# Patient Record
Sex: Male | Born: 1940 | Race: White | Hispanic: No | Marital: Married | Smoking: Former smoker
Health system: Southern US, Community
[De-identification: ages and names within clinical notes are randomized; demographics above are authoritative.]

## PROBLEM LIST (undated history)

## (undated) DIAGNOSIS — N400 Enlarged prostate without lower urinary tract symptoms: Secondary | ICD-10-CM

## (undated) DIAGNOSIS — K219 Gastro-esophageal reflux disease without esophagitis: Secondary | ICD-10-CM

## (undated) DIAGNOSIS — K5790 Diverticulosis of intestine, part unspecified, without perforation or abscess without bleeding: Secondary | ICD-10-CM

## (undated) DIAGNOSIS — D45 Polycythemia vera: Secondary | ICD-10-CM

## (undated) DIAGNOSIS — K279 Peptic ulcer, site unspecified, unspecified as acute or chronic, without hemorrhage or perforation: Secondary | ICD-10-CM

## (undated) DIAGNOSIS — Z860100 Personal history of colon polyps, unspecified: Secondary | ICD-10-CM

## (undated) DIAGNOSIS — M109 Gout, unspecified: Secondary | ICD-10-CM

## (undated) HISTORY — DX: Personal history of colon polyps, unspecified: Z86.0100

## (undated) HISTORY — DX: Gout, unspecified: M10.9

## (undated) HISTORY — DX: Benign prostatic hyperplasia without lower urinary tract symptoms: N40.0

## (undated) HISTORY — DX: Diverticulosis of intestine, part unspecified, without perforation or abscess without bleeding: K57.90

## (undated) HISTORY — DX: Gastro-esophageal reflux disease without esophagitis: K21.9

## (undated) HISTORY — PX: APPENDECTOMY: SHX54

## (undated) HISTORY — DX: Polycythemia vera: D45

## (undated) HISTORY — PX: COLONOSCOPY: SHX174

## (undated) HISTORY — DX: Personal history of colonic polyps: Z86.010

## (undated) HISTORY — PX: POLYPECTOMY: SHX149

## (undated) HISTORY — DX: Peptic ulcer, site unspecified, unspecified as acute or chronic, without hemorrhage or perforation: K27.9

---

## 1996-06-24 ENCOUNTER — Encounter: Payer: Self-pay | Admitting: Gastroenterology

## 1996-08-31 ENCOUNTER — Encounter: Payer: Self-pay | Admitting: Gastroenterology

## 1996-10-20 ENCOUNTER — Encounter: Payer: Self-pay | Admitting: Internal Medicine

## 1998-11-22 ENCOUNTER — Encounter: Payer: Self-pay | Admitting: Internal Medicine

## 2001-04-08 ENCOUNTER — Encounter: Payer: Self-pay | Admitting: Internal Medicine

## 2002-12-11 ENCOUNTER — Encounter: Payer: Self-pay | Admitting: Internal Medicine

## 2003-06-15 ENCOUNTER — Encounter: Payer: Self-pay | Admitting: Internal Medicine

## 2004-03-29 ENCOUNTER — Encounter: Admission: RE | Admit: 2004-03-29 | Discharge: 2004-03-29 | Payer: Self-pay | Admitting: Oncology

## 2004-03-29 ENCOUNTER — Encounter (HOSPITAL_COMMUNITY): Admission: RE | Admit: 2004-03-29 | Discharge: 2004-04-28 | Payer: Self-pay | Admitting: Oncology

## 2004-05-09 ENCOUNTER — Ambulatory Visit: Payer: Self-pay | Admitting: Oncology

## 2004-07-31 ENCOUNTER — Ambulatory Visit: Payer: Self-pay | Admitting: Internal Medicine

## 2004-08-03 ENCOUNTER — Ambulatory Visit: Payer: Self-pay | Admitting: Oncology

## 2004-10-05 ENCOUNTER — Ambulatory Visit: Payer: Self-pay | Admitting: Oncology

## 2004-11-22 ENCOUNTER — Ambulatory Visit: Payer: Self-pay | Admitting: Oncology

## 2005-01-08 ENCOUNTER — Ambulatory Visit: Payer: Self-pay | Admitting: Oncology

## 2005-01-28 ENCOUNTER — Emergency Department (HOSPITAL_COMMUNITY): Admission: EM | Admit: 2005-01-28 | Discharge: 2005-01-28 | Payer: Self-pay | Admitting: Emergency Medicine

## 2005-02-28 ENCOUNTER — Ambulatory Visit: Payer: Self-pay | Admitting: Internal Medicine

## 2005-03-12 ENCOUNTER — Ambulatory Visit: Payer: Self-pay | Admitting: Oncology

## 2005-04-10 ENCOUNTER — Ambulatory Visit: Payer: Self-pay | Admitting: Internal Medicine

## 2005-05-14 ENCOUNTER — Ambulatory Visit: Payer: Self-pay | Admitting: Oncology

## 2005-07-16 ENCOUNTER — Ambulatory Visit: Payer: Self-pay | Admitting: Oncology

## 2005-09-11 ENCOUNTER — Ambulatory Visit: Payer: Self-pay | Admitting: Oncology

## 2005-09-11 LAB — CBC WITH DIFFERENTIAL/PLATELET
Basophils Absolute: 0 10*3/uL (ref 0.0–0.1)
EOS%: 2.4 % (ref 0.0–7.0)
Eosinophils Absolute: 0.2 10*3/uL (ref 0.0–0.5)
HCT: 47.8 % (ref 38.7–49.9)
HGB: 15.1 g/dL (ref 13.0–17.1)
LYMPH%: 9.2 % — ABNORMAL LOW (ref 14.0–48.0)
MCH: 25.9 pg — ABNORMAL LOW (ref 28.0–33.4)
MCV: 81.9 fL (ref 81.6–98.0)
MONO%: 3.5 % (ref 0.0–13.0)
NEUT#: 8.6 10*3/uL — ABNORMAL HIGH (ref 1.5–6.5)
NEUT%: 84.6 % — ABNORMAL HIGH (ref 40.0–75.0)
Platelets: 555 10*3/uL — ABNORMAL HIGH (ref 145–400)
RDW: 19.3 % — ABNORMAL HIGH (ref 11.2–14.6)

## 2005-10-09 LAB — COMPREHENSIVE METABOLIC PANEL
AST: 24 U/L (ref 0–37)
Albumin: 4.7 g/dL (ref 3.5–5.2)
BUN: 18 mg/dL (ref 6–23)
Calcium: 9.2 mg/dL (ref 8.4–10.5)
Chloride: 111 mEq/L (ref 96–112)
Creatinine, Ser: 1.1 mg/dL (ref 0.4–1.5)
Glucose, Bld: 109 mg/dL — ABNORMAL HIGH (ref 70–99)

## 2005-10-09 LAB — MORPHOLOGY

## 2005-10-09 LAB — URIC ACID: Uric Acid, Serum: 8.6 mg/dL — ABNORMAL HIGH (ref 2.4–7.0)

## 2005-10-09 LAB — CBC WITH DIFFERENTIAL/PLATELET
Basophils Absolute: 0.1 10*3/uL (ref 0.0–0.1)
Eosinophils Absolute: 0.2 10*3/uL (ref 0.0–0.5)
HGB: 15.2 g/dL (ref 13.0–17.1)
LYMPH%: 8.6 % — ABNORMAL LOW (ref 14.0–48.0)
MCV: 83.2 fL (ref 81.6–98.0)
MONO#: 0.2 10*3/uL (ref 0.1–0.9)
MONO%: 2.3 % (ref 0.0–13.0)
NEUT#: 9.3 10*3/uL — ABNORMAL HIGH (ref 1.5–6.5)
Platelets: 580 10*3/uL — ABNORMAL HIGH (ref 145–400)
WBC: 10.8 10*3/uL — ABNORMAL HIGH (ref 4.0–10.0)

## 2005-10-09 LAB — PSA: PSA: 0.57 ng/mL (ref 0.10–4.00)

## 2005-10-09 LAB — LACTATE DEHYDROGENASE: LDH: 173 U/L (ref 94–250)

## 2005-11-07 ENCOUNTER — Ambulatory Visit: Payer: Self-pay | Admitting: Oncology

## 2005-11-13 LAB — CBC WITH DIFFERENTIAL/PLATELET
BASO%: 0.5 % (ref 0.0–2.0)
EOS%: 2 % (ref 0.0–7.0)
LYMPH%: 10.7 % — ABNORMAL LOW (ref 14.0–48.0)
MCH: 26.4 pg — ABNORMAL LOW (ref 28.0–33.4)
MCHC: 32 g/dL (ref 32.0–35.9)
MCV: 82.6 fL (ref 81.6–98.0)
MONO%: 2.9 % (ref 0.0–13.0)
Platelets: 539 10*3/uL — ABNORMAL HIGH (ref 145–400)
RBC: 5.88 10*6/uL — ABNORMAL HIGH (ref 4.20–5.71)
WBC: 9.1 10*3/uL (ref 4.0–10.0)

## 2005-11-13 LAB — MORPHOLOGY

## 2006-01-09 ENCOUNTER — Ambulatory Visit: Payer: Self-pay | Admitting: Oncology

## 2006-01-10 LAB — CBC WITH DIFFERENTIAL/PLATELET
Basophils Absolute: 0 10*3/uL (ref 0.0–0.1)
Eosinophils Absolute: 0.2 10*3/uL (ref 0.0–0.5)
HGB: 15.5 g/dL (ref 13.0–17.1)
LYMPH%: 9.7 % — ABNORMAL LOW (ref 14.0–48.0)
MCV: 84.4 fL (ref 81.6–98.0)
MONO%: 2.8 % (ref 0.0–13.0)
NEUT#: 7.7 10*3/uL — ABNORMAL HIGH (ref 1.5–6.5)
Platelets: 518 10*3/uL — ABNORMAL HIGH (ref 145–400)
RBC: 5.83 10*6/uL — ABNORMAL HIGH (ref 4.20–5.71)

## 2006-01-10 LAB — MORPHOLOGY: PLT EST: INCREASED

## 2006-02-12 LAB — CBC WITH DIFFERENTIAL/PLATELET
BASO%: 0.1 % (ref 0.0–2.0)
HCT: 49.1 % (ref 38.7–49.9)
HGB: 15.5 g/dL (ref 13.0–17.1)
MCHC: 31.6 g/dL — ABNORMAL LOW (ref 32.0–35.9)
MONO#: 0.3 10*3/uL (ref 0.1–0.9)
NEUT%: 85.6 % — ABNORMAL HIGH (ref 40.0–75.0)
RDW: 18.7 % — ABNORMAL HIGH (ref 11.2–14.6)
WBC: 11.5 10*3/uL — ABNORMAL HIGH (ref 4.0–10.0)
lymph#: 1.1 10*3/uL (ref 0.9–3.3)

## 2006-02-12 LAB — MORPHOLOGY

## 2006-03-08 ENCOUNTER — Ambulatory Visit: Payer: Self-pay | Admitting: Internal Medicine

## 2006-03-08 ENCOUNTER — Ambulatory Visit: Payer: Self-pay | Admitting: Oncology

## 2006-03-12 LAB — CBC WITH DIFFERENTIAL/PLATELET
BASO%: 0.2 % (ref 0.0–2.0)
EOS%: 2 % (ref 0.0–7.0)
HCT: 49.5 % (ref 38.7–49.9)
MCH: 26.6 pg — ABNORMAL LOW (ref 28.0–33.4)
MCHC: 31.3 g/dL — ABNORMAL LOW (ref 32.0–35.9)
MCV: 84.8 fL (ref 81.6–98.0)
MONO%: 2.3 % (ref 0.0–13.0)
NEUT%: 86.8 % — ABNORMAL HIGH (ref 40.0–75.0)
RDW: 18.5 % — ABNORMAL HIGH (ref 11.2–14.6)
lymph#: 1 10*3/uL (ref 0.9–3.3)

## 2006-03-12 LAB — MORPHOLOGY: PLT EST: INCREASED

## 2006-03-22 ENCOUNTER — Ambulatory Visit: Payer: Self-pay | Admitting: Internal Medicine

## 2006-03-22 ENCOUNTER — Encounter (INDEPENDENT_AMBULATORY_CARE_PROVIDER_SITE_OTHER): Payer: Self-pay | Admitting: *Deleted

## 2006-03-27 ENCOUNTER — Ambulatory Visit: Payer: Self-pay | Admitting: Internal Medicine

## 2006-04-09 LAB — COMPREHENSIVE METABOLIC PANEL
ALT: 20 U/L (ref 0–40)
AST: 23 U/L (ref 0–37)
Calcium: 9.5 mg/dL (ref 8.4–10.5)
Chloride: 107 mEq/L (ref 96–112)
Creatinine, Ser: 0.98 mg/dL (ref 0.40–1.50)

## 2006-04-09 LAB — CBC WITH DIFFERENTIAL/PLATELET
Basophils Absolute: 0 10*3/uL (ref 0.0–0.1)
Eosinophils Absolute: 0.1 10*3/uL (ref 0.0–0.5)
HCT: 51.4 % — ABNORMAL HIGH (ref 38.7–49.9)
HGB: 16.1 g/dL (ref 13.0–17.1)
MCH: 26.8 pg — ABNORMAL LOW (ref 28.0–33.4)
MONO#: 0.2 10*3/uL (ref 0.1–0.9)
NEUT#: 8.3 10*3/uL — ABNORMAL HIGH (ref 1.5–6.5)
NEUT%: 87.8 % — ABNORMAL HIGH (ref 40.0–75.0)
RDW: 19.6 % — ABNORMAL HIGH (ref 11.2–14.6)
lymph#: 0.8 10*3/uL — ABNORMAL LOW (ref 0.9–3.3)

## 2006-04-09 LAB — MORPHOLOGY: PLT EST: ADEQUATE

## 2006-04-09 LAB — ERYTHROCYTE SEDIMENTATION RATE: Sed Rate: 0 mm/hr (ref 0–20)

## 2006-04-24 ENCOUNTER — Ambulatory Visit: Payer: Self-pay | Admitting: Oncology

## 2006-05-24 LAB — CBC WITH DIFFERENTIAL/PLATELET
BASO%: 1 % (ref 0.0–2.0)
Eosinophils Absolute: 0.2 10*3/uL (ref 0.0–0.5)
MONO#: 0.4 10*3/uL (ref 0.1–0.9)
NEUT#: 9.9 10*3/uL — ABNORMAL HIGH (ref 1.5–6.5)
RBC: 5.87 10*6/uL — ABNORMAL HIGH (ref 4.20–5.71)
RDW: 17.7 % — ABNORMAL HIGH (ref 11.2–14.6)
WBC: 11.5 10*3/uL — ABNORMAL HIGH (ref 4.0–10.0)

## 2006-05-24 LAB — MORPHOLOGY: PLT EST: INCREASED

## 2006-06-18 ENCOUNTER — Ambulatory Visit: Payer: Self-pay | Admitting: Oncology

## 2006-06-21 LAB — CBC WITH DIFFERENTIAL/PLATELET
EOS%: 1.4 % (ref 0.0–7.0)
Eosinophils Absolute: 0.1 10*3/uL (ref 0.0–0.5)
LYMPH%: 9.9 % — ABNORMAL LOW (ref 14.0–48.0)
MCH: 27.4 pg — ABNORMAL LOW (ref 28.0–33.4)
MCV: 86.8 fL (ref 81.6–98.0)
MONO%: 3.1 % (ref 0.0–13.0)
NEUT#: 8.6 10*3/uL — ABNORMAL HIGH (ref 1.5–6.5)
Platelets: 517 10*3/uL — ABNORMAL HIGH (ref 145–400)
RBC: 6 10*6/uL — ABNORMAL HIGH (ref 4.20–5.71)
RDW: 18.6 % — ABNORMAL HIGH (ref 11.2–14.6)

## 2006-06-21 LAB — MORPHOLOGY

## 2006-07-19 LAB — CBC WITH DIFFERENTIAL/PLATELET
Eosinophils Absolute: 0.2 10*3/uL (ref 0.0–0.5)
HCT: 48.2 % (ref 38.7–49.9)
LYMPH%: 8.8 % — ABNORMAL LOW (ref 14.0–48.0)
MCHC: 32.4 g/dL (ref 32.0–35.9)
MCV: 85.7 fL (ref 81.6–98.0)
MONO#: 0.2 10*3/uL (ref 0.1–0.9)
MONO%: 2.7 % (ref 0.0–13.0)
NEUT#: 7.7 10*3/uL — ABNORMAL HIGH (ref 1.5–6.5)
NEUT%: 86 % — ABNORMAL HIGH (ref 40.0–75.0)
Platelets: 443 10*3/uL — ABNORMAL HIGH (ref 145–400)
WBC: 9 10*3/uL (ref 4.0–10.0)

## 2006-07-19 LAB — MORPHOLOGY: PLT EST: INCREASED

## 2006-08-13 ENCOUNTER — Ambulatory Visit: Payer: Self-pay | Admitting: Oncology

## 2006-08-16 LAB — CBC WITH DIFFERENTIAL/PLATELET
BASO%: 0.3 % (ref 0.0–2.0)
LYMPH%: 9.2 % — ABNORMAL LOW (ref 14.0–48.0)
MCHC: 32.3 g/dL (ref 32.0–35.9)
MONO#: 0.2 10*3/uL (ref 0.1–0.9)
RBC: 5.72 10*6/uL — ABNORMAL HIGH (ref 4.20–5.71)
RDW: 19.2 % — ABNORMAL HIGH (ref 11.2–14.6)
WBC: 9.9 10*3/uL (ref 4.0–10.0)
lymph#: 0.9 10*3/uL (ref 0.9–3.3)

## 2006-08-16 LAB — MORPHOLOGY: PLT EST: INCREASED

## 2006-08-16 LAB — COMPREHENSIVE METABOLIC PANEL
AST: 20 U/L (ref 0–37)
Albumin: 4.8 g/dL (ref 3.5–5.2)
Alkaline Phosphatase: 55 U/L (ref 39–117)
Glucose, Bld: 125 mg/dL — ABNORMAL HIGH (ref 70–99)
Potassium: 5.4 mEq/L — ABNORMAL HIGH (ref 3.5–5.3)
Sodium: 145 mEq/L (ref 135–145)
Total Protein: 6.7 g/dL (ref 6.0–8.3)

## 2006-09-20 LAB — CBC WITH DIFFERENTIAL/PLATELET
BASO%: 0.2 % (ref 0.0–2.0)
LYMPH%: 10.5 % — ABNORMAL LOW (ref 14.0–48.0)
MCHC: 32.5 g/dL (ref 32.0–35.9)
MCV: 86.7 fL (ref 81.6–98.0)
MONO%: 3.3 % (ref 0.0–13.0)
Platelets: 443 10*3/uL — ABNORMAL HIGH (ref 145–400)
RBC: 5.74 10*6/uL — ABNORMAL HIGH (ref 4.20–5.71)

## 2006-09-20 LAB — MORPHOLOGY

## 2006-10-16 ENCOUNTER — Ambulatory Visit: Payer: Self-pay | Admitting: Oncology

## 2006-10-18 LAB — CBC WITH DIFFERENTIAL/PLATELET
BASO%: 0.3 % (ref 0.0–2.0)
Eosinophils Absolute: 0.1 10*3/uL (ref 0.0–0.5)
MCHC: 32.5 g/dL (ref 32.0–35.9)
MONO#: 0.2 10*3/uL (ref 0.1–0.9)
NEUT#: 9.5 10*3/uL — ABNORMAL HIGH (ref 1.5–6.5)
RBC: 5.25 10*6/uL (ref 4.20–5.71)
RDW: 17.9 % — ABNORMAL HIGH (ref 11.2–14.6)
WBC: 10.7 10*3/uL — ABNORMAL HIGH (ref 4.0–10.0)
lymph#: 0.8 10*3/uL — ABNORMAL LOW (ref 0.9–3.3)

## 2006-10-18 LAB — MORPHOLOGY: PLT EST: INCREASED

## 2006-10-18 LAB — COMPREHENSIVE METABOLIC PANEL
Albumin: 4.4 g/dL (ref 3.5–5.2)
CO2: 29 mEq/L (ref 19–32)
Calcium: 9.2 mg/dL (ref 8.4–10.5)
Creatinine, Ser: 1.04 mg/dL (ref 0.40–1.50)
Total Protein: 6.4 g/dL (ref 6.0–8.3)

## 2006-11-25 LAB — MORPHOLOGY: PLT EST: INCREASED

## 2006-11-25 LAB — CBC WITH DIFFERENTIAL/PLATELET
BASO%: 0.5 % (ref 0.0–2.0)
Eosinophils Absolute: 0.2 10*3/uL (ref 0.0–0.5)
LYMPH%: 8.9 % — ABNORMAL LOW (ref 14.0–48.0)
MCHC: 32 g/dL (ref 32.0–35.9)
MCV: 86.3 fL (ref 81.6–98.0)
MONO#: 0.3 10*3/uL (ref 0.1–0.9)
MONO%: 2.8 % (ref 0.0–13.0)
NEUT#: 9.8 10*3/uL — ABNORMAL HIGH (ref 1.5–6.5)
RBC: 5.53 10*6/uL (ref 4.20–5.71)
RDW: 17 % — ABNORMAL HIGH (ref 11.2–14.6)
WBC: 11.5 10*3/uL — ABNORMAL HIGH (ref 4.0–10.0)

## 2006-12-25 ENCOUNTER — Ambulatory Visit: Payer: Self-pay | Admitting: Oncology

## 2006-12-30 LAB — CBC WITH DIFFERENTIAL/PLATELET
BASO%: 0.4 % (ref 0.0–2.0)
EOS%: 2 % (ref 0.0–7.0)
HCT: 47.6 % (ref 38.7–49.9)
MCH: 27.5 pg — ABNORMAL LOW (ref 28.0–33.4)
MCHC: 32.4 g/dL (ref 32.0–35.9)
MONO#: 0.4 10*3/uL (ref 0.1–0.9)
RBC: 5.6 10*6/uL (ref 4.20–5.71)
RDW: 18.4 % — ABNORMAL HIGH (ref 11.2–14.6)
WBC: 11.3 10*3/uL — ABNORMAL HIGH (ref 4.0–10.0)
lymph#: 0.9 10*3/uL (ref 0.9–3.3)

## 2006-12-30 LAB — MORPHOLOGY

## 2007-01-24 LAB — CBC WITH DIFFERENTIAL/PLATELET
BASO%: 1.6 % (ref 0.0–2.0)
HCT: 47.1 % (ref 38.7–49.9)
LYMPH%: 7.5 % — ABNORMAL LOW (ref 14.0–48.0)
MCHC: 32.3 g/dL (ref 32.0–35.9)
MCV: 84 fL (ref 81.6–98.0)
MONO#: 0.3 10*3/uL (ref 0.1–0.9)
MONO%: 2.7 % (ref 0.0–13.0)
NEUT%: 85.8 % — ABNORMAL HIGH (ref 40.0–75.0)
Platelets: 574 10*3/uL — ABNORMAL HIGH (ref 145–400)
RBC: 5.61 10*6/uL (ref 4.20–5.71)

## 2007-01-24 LAB — MORPHOLOGY: PLT EST: INCREASED

## 2007-02-08 ENCOUNTER — Encounter: Payer: Self-pay | Admitting: Internal Medicine

## 2007-02-08 DIAGNOSIS — Z860100 Personal history of colon polyps, unspecified: Secondary | ICD-10-CM | POA: Insufficient documentation

## 2007-02-08 DIAGNOSIS — D45 Polycythemia vera: Secondary | ICD-10-CM | POA: Insufficient documentation

## 2007-02-26 ENCOUNTER — Ambulatory Visit: Payer: Self-pay | Admitting: Oncology

## 2007-02-28 LAB — MORPHOLOGY: PLT EST: INCREASED

## 2007-02-28 LAB — CBC WITH DIFFERENTIAL/PLATELET
Basophils Absolute: 0 10*3/uL (ref 0.0–0.1)
Eosinophils Absolute: 0.1 10*3/uL (ref 0.0–0.5)
HGB: 15.4 g/dL (ref 13.0–17.1)
MCV: 83.8 fL (ref 81.6–98.0)
MONO#: 0.3 10*3/uL (ref 0.1–0.9)
NEUT#: 8.6 10*3/uL — ABNORMAL HIGH (ref 1.5–6.5)
RBC: 5.63 10*6/uL (ref 4.20–5.71)
RDW: 18.5 % — ABNORMAL HIGH (ref 11.2–14.6)
WBC: 9.7 10*3/uL (ref 4.0–10.0)

## 2007-03-28 LAB — CBC WITH DIFFERENTIAL/PLATELET
BASO%: 0.2 % (ref 0.0–2.0)
Basophils Absolute: 0 10*3/uL (ref 0.0–0.1)
EOS%: 1.8 % (ref 0.0–7.0)
HGB: 16.3 g/dL (ref 13.0–17.1)
MCH: 27.3 pg — ABNORMAL LOW (ref 28.0–33.4)
MONO#: 0.3 10*3/uL (ref 0.1–0.9)
RDW: 19.1 % — ABNORMAL HIGH (ref 11.2–14.6)
WBC: 12.1 10*3/uL — ABNORMAL HIGH (ref 4.0–10.0)
lymph#: 1.1 10*3/uL (ref 0.9–3.3)

## 2007-03-28 LAB — MORPHOLOGY

## 2007-04-16 ENCOUNTER — Ambulatory Visit: Payer: Self-pay | Admitting: Oncology

## 2007-05-05 LAB — CBC WITH DIFFERENTIAL/PLATELET
BASO%: 0.4 % (ref 0.0–2.0)
LYMPH%: 7.4 % — ABNORMAL LOW (ref 14.0–48.0)
MCHC: 31.9 g/dL — ABNORMAL LOW (ref 32.0–35.9)
MCV: 85.4 fL (ref 81.6–98.0)
MONO%: 2.4 % (ref 0.0–13.0)
Platelets: 422 10*3/uL — ABNORMAL HIGH (ref 145–400)
RBC: 5.53 10*6/uL (ref 4.20–5.71)
WBC: 9.6 10*3/uL (ref 4.0–10.0)

## 2007-05-05 LAB — MORPHOLOGY

## 2007-06-06 ENCOUNTER — Ambulatory Visit: Payer: Self-pay | Admitting: Oncology

## 2007-06-06 LAB — CBC WITH DIFFERENTIAL/PLATELET
Basophils Absolute: 0.2 10*3/uL — ABNORMAL HIGH (ref 0.0–0.1)
EOS%: 1.9 % (ref 0.0–7.0)
HCT: 48 % (ref 38.7–49.9)
HGB: 15.5 g/dL (ref 13.0–17.1)
MCH: 27.7 pg — ABNORMAL LOW (ref 28.0–33.4)
MCV: 85.5 fL (ref 81.6–98.0)
MONO%: 2.9 % (ref 0.0–13.0)
NEUT%: 84 % — ABNORMAL HIGH (ref 40.0–75.0)

## 2007-07-03 LAB — CBC WITH DIFFERENTIAL/PLATELET
BASO%: 0.2 % (ref 0.0–2.0)
LYMPH%: 8 % — ABNORMAL LOW (ref 14.0–48.0)
MCHC: 32.5 g/dL (ref 32.0–35.9)
MONO#: 0.3 10*3/uL (ref 0.1–0.9)
Platelets: 569 10*3/uL — ABNORMAL HIGH (ref 145–400)
RBC: 5.6 10*6/uL (ref 4.20–5.71)
RDW: 18.2 % — ABNORMAL HIGH (ref 11.2–14.6)
WBC: 11.4 10*3/uL — ABNORMAL HIGH (ref 4.0–10.0)

## 2007-07-03 LAB — MORPHOLOGY

## 2007-07-29 ENCOUNTER — Ambulatory Visit: Payer: Self-pay | Admitting: Oncology

## 2007-07-31 LAB — CBC WITH DIFFERENTIAL/PLATELET
Basophils Absolute: 0.1 10*3/uL (ref 0.0–0.1)
Eosinophils Absolute: 0.2 10*3/uL (ref 0.0–0.5)
HCT: 48.3 % (ref 38.7–49.9)
HGB: 15.6 g/dL (ref 13.0–17.1)
LYMPH%: 9.3 % — ABNORMAL LOW (ref 14.0–48.0)
MCV: 87.1 fL (ref 81.6–98.0)
MONO#: 0.2 10*3/uL (ref 0.1–0.9)
NEUT#: 9.3 10*3/uL — ABNORMAL HIGH (ref 1.5–6.5)
Platelets: 568 10*3/uL — ABNORMAL HIGH (ref 145–400)
RBC: 5.54 10*6/uL (ref 4.20–5.71)
WBC: 10.9 10*3/uL — ABNORMAL HIGH (ref 4.0–10.0)

## 2007-07-31 LAB — MORPHOLOGY: PLT EST: INCREASED

## 2007-08-28 LAB — CBC WITH DIFFERENTIAL/PLATELET
BASO%: 0.8 % (ref 0.0–2.0)
EOS%: 2.1 % (ref 0.0–7.0)
LYMPH%: 9.7 % — ABNORMAL LOW (ref 14.0–48.0)
MCHC: 32.2 g/dL (ref 32.0–35.9)
MCV: 87.2 fL (ref 81.6–98.0)
MONO%: 2.8 % (ref 0.0–13.0)
NEUT#: 9.6 10*3/uL — ABNORMAL HIGH (ref 1.5–6.5)
Platelets: 577 10*3/uL — ABNORMAL HIGH (ref 145–400)
RBC: 5.5 10*6/uL (ref 4.20–5.71)
RDW: 19.1 % — ABNORMAL HIGH (ref 11.2–14.6)

## 2007-08-28 LAB — MORPHOLOGY

## 2007-09-19 ENCOUNTER — Ambulatory Visit: Payer: Self-pay | Admitting: Oncology

## 2007-09-29 LAB — CBC WITH DIFFERENTIAL/PLATELET
Basophils Absolute: 0 10*3/uL (ref 0.0–0.1)
EOS%: 1.6 % (ref 0.0–7.0)
Eosinophils Absolute: 0.2 10*3/uL (ref 0.0–0.5)
HGB: 15.2 g/dL (ref 13.0–17.1)
LYMPH%: 8.2 % — ABNORMAL LOW (ref 14.0–48.0)
MCH: 27.9 pg — ABNORMAL LOW (ref 28.0–33.4)
MCV: 87.2 fL (ref 81.6–98.0)
MONO%: 3.3 % (ref 0.0–13.0)
NEUT#: 8.2 10*3/uL — ABNORMAL HIGH (ref 1.5–6.5)
Platelets: 443 10*3/uL — ABNORMAL HIGH (ref 145–400)
RBC: 5.44 10*6/uL (ref 4.20–5.71)
RDW: 19.2 % — ABNORMAL HIGH (ref 11.2–14.6)

## 2007-10-29 ENCOUNTER — Ambulatory Visit: Payer: Self-pay | Admitting: Oncology

## 2007-11-14 ENCOUNTER — Encounter: Payer: Self-pay | Admitting: Internal Medicine

## 2008-01-04 ENCOUNTER — Ambulatory Visit: Payer: Self-pay | Admitting: Oncology

## 2008-01-09 LAB — CBC WITH DIFFERENTIAL/PLATELET
BASO%: 0.4 % (ref 0.0–2.0)
EOS%: 1.9 % (ref 0.0–7.0)
LYMPH%: 8.9 % — ABNORMAL LOW (ref 14.0–48.0)
MCH: 28.5 pg (ref 28.0–33.4)
MCHC: 31.7 g/dL — ABNORMAL LOW (ref 32.0–35.9)
MCV: 90 fL (ref 81.6–98.0)
MONO#: 0.2 10*3/uL (ref 0.1–0.9)
MONO%: 2.2 % (ref 0.0–13.0)
Platelets: 492 10*3/uL — ABNORMAL HIGH (ref 145–400)
RBC: 5.65 10*6/uL (ref 4.20–5.71)
WBC: 10.1 10*3/uL — ABNORMAL HIGH (ref 4.0–10.0)

## 2008-01-09 LAB — MORPHOLOGY

## 2008-02-06 LAB — CBC WITH DIFFERENTIAL/PLATELET
BASO%: 0.2 % (ref 0.0–2.0)
HCT: 47.4 % (ref 38.7–49.9)
HGB: 15.1 g/dL (ref 13.0–17.1)
MCHC: 31.8 g/dL — ABNORMAL LOW (ref 32.0–35.9)
MONO#: 0.3 10*3/uL (ref 0.1–0.9)
NEUT%: 87.3 % — ABNORMAL HIGH (ref 40.0–75.0)
WBC: 9.9 10*3/uL (ref 4.0–10.0)
lymph#: 0.7 10*3/uL — ABNORMAL LOW (ref 0.9–3.3)

## 2008-02-06 LAB — MORPHOLOGY

## 2008-02-26 ENCOUNTER — Ambulatory Visit: Payer: Self-pay | Admitting: Internal Medicine

## 2008-02-26 DIAGNOSIS — K219 Gastro-esophageal reflux disease without esophagitis: Secondary | ICD-10-CM

## 2008-02-26 DIAGNOSIS — R9431 Abnormal electrocardiogram [ECG] [EKG]: Secondary | ICD-10-CM

## 2008-02-26 DIAGNOSIS — M79609 Pain in unspecified limb: Secondary | ICD-10-CM

## 2008-03-03 ENCOUNTER — Ambulatory Visit: Payer: Self-pay | Admitting: Oncology

## 2008-03-04 ENCOUNTER — Ambulatory Visit: Payer: Self-pay

## 2008-03-05 LAB — MORPHOLOGY

## 2008-03-05 LAB — CBC WITH DIFFERENTIAL/PLATELET
BASO%: 0 % (ref 0.0–2.0)
EOS%: 1.7 % (ref 0.0–7.0)
HCT: 47.9 % (ref 38.7–49.9)
LYMPH%: 7 % — ABNORMAL LOW (ref 14.0–48.0)
MCH: 28.4 pg (ref 28.0–33.4)
MCHC: 31.9 g/dL — ABNORMAL LOW (ref 32.0–35.9)
MCV: 88.8 fL (ref 81.6–98.0)
NEUT%: 88.9 % — ABNORMAL HIGH (ref 40.0–75.0)
Platelets: 557 10*3/uL — ABNORMAL HIGH (ref 145–400)

## 2008-04-02 LAB — CBC WITH DIFFERENTIAL/PLATELET
BASO%: 0.2 % (ref 0.0–2.0)
Basophils Absolute: 0 10*3/uL (ref 0.0–0.1)
EOS%: 1.6 % (ref 0.0–7.0)
HGB: 15.3 g/dL (ref 13.0–17.1)
MCH: 28.2 pg (ref 28.0–33.4)
MCV: 87.7 fL (ref 81.6–98.0)
MONO%: 2.6 % (ref 0.0–13.0)
RBC: 5.41 10*6/uL (ref 4.20–5.71)
RDW: 17.4 % — ABNORMAL HIGH (ref 11.2–14.6)
lymph#: 0.6 10*3/uL — ABNORMAL LOW (ref 0.9–3.3)

## 2008-04-02 LAB — MORPHOLOGY

## 2008-04-28 ENCOUNTER — Ambulatory Visit: Payer: Self-pay | Admitting: Oncology

## 2008-04-30 LAB — MORPHOLOGY: PLT EST: INCREASED

## 2008-04-30 LAB — CBC WITH DIFFERENTIAL/PLATELET
Basophils Absolute: 0 10*3/uL (ref 0.0–0.1)
Eosinophils Absolute: 0.2 10*3/uL (ref 0.0–0.5)
HGB: 15.6 g/dL (ref 13.0–17.1)
LYMPH%: 6.4 % — ABNORMAL LOW (ref 14.0–48.0)
MCV: 87.6 fL (ref 81.6–98.0)
MONO#: 0.3 10*3/uL (ref 0.1–0.9)
MONO%: 2.5 % (ref 0.0–13.0)
NEUT#: 10.3 10*3/uL — ABNORMAL HIGH (ref 1.5–6.5)
Platelets: 480 10*3/uL — ABNORMAL HIGH (ref 145–400)
RDW: 18 % — ABNORMAL HIGH (ref 11.2–14.6)
WBC: 11.6 10*3/uL — ABNORMAL HIGH (ref 4.0–10.0)

## 2008-05-19 ENCOUNTER — Ambulatory Visit: Payer: Self-pay | Admitting: Internal Medicine

## 2008-05-28 LAB — CBC WITH DIFFERENTIAL/PLATELET
Eosinophils Absolute: 0.2 10*3/uL (ref 0.0–0.5)
MONO#: 0.5 10*3/uL (ref 0.1–0.9)
MONO%: 4.7 % (ref 0.0–13.0)
NEUT#: 8.9 10*3/uL — ABNORMAL HIGH (ref 1.5–6.5)
RBC: 5.7 10*6/uL (ref 4.20–5.71)
RDW: 20.3 % — ABNORMAL HIGH (ref 11.2–14.6)
WBC: 10.3 10*3/uL — ABNORMAL HIGH (ref 4.0–10.0)
lymph#: 0.5 10*3/uL — ABNORMAL LOW (ref 0.9–3.3)

## 2008-05-28 LAB — MORPHOLOGY

## 2008-06-23 ENCOUNTER — Ambulatory Visit: Payer: Self-pay | Admitting: Oncology

## 2008-06-25 LAB — CBC WITH DIFFERENTIAL/PLATELET
Basophils Absolute: 0 10*3/uL (ref 0.0–0.1)
EOS%: 1.6 % (ref 0.0–7.0)
Eosinophils Absolute: 0.2 10*3/uL (ref 0.0–0.5)
HGB: 15.5 g/dL (ref 13.0–17.1)
LYMPH%: 7.1 % — ABNORMAL LOW (ref 14.0–48.0)
MCH: 28.2 pg (ref 28.0–33.4)
MCV: 88.8 fL (ref 81.6–98.0)
MONO%: 1.3 % (ref 0.0–13.0)
NEUT#: 10.3 10*3/uL — ABNORMAL HIGH (ref 1.5–6.5)
Platelets: 461 10*3/uL — ABNORMAL HIGH (ref 145–400)

## 2008-06-25 LAB — MORPHOLOGY
PLT EST: INCREASED
RBC Comments: 1

## 2008-07-23 LAB — CBC WITH DIFFERENTIAL/PLATELET
BASO%: 0.3 % (ref 0.0–2.0)
Eosinophils Absolute: 0.2 10*3/uL (ref 0.0–0.5)
LYMPH%: 7 % — ABNORMAL LOW (ref 14.0–48.0)
MCHC: 32.1 g/dL (ref 32.0–35.9)
MONO#: 0.2 10*3/uL (ref 0.1–0.9)
MONO%: 1.9 % (ref 0.0–13.0)
NEUT#: 9 10*3/uL — ABNORMAL HIGH (ref 1.5–6.5)
Platelets: 444 10*3/uL — ABNORMAL HIGH (ref 145–400)
RBC: 5.45 10*6/uL (ref 4.20–5.71)
RDW: 19.3 % — ABNORMAL HIGH (ref 11.2–14.6)
WBC: 10.1 10*3/uL — ABNORMAL HIGH (ref 4.0–10.0)

## 2008-07-23 LAB — MORPHOLOGY

## 2008-08-18 ENCOUNTER — Ambulatory Visit: Payer: Self-pay | Admitting: Oncology

## 2008-08-20 LAB — CBC WITH DIFFERENTIAL/PLATELET
Basophils Absolute: 0.1 10*3/uL (ref 0.0–0.1)
Eosinophils Absolute: 0.2 10*3/uL (ref 0.0–0.5)
HCT: 51.5 % — ABNORMAL HIGH (ref 38.4–49.9)
LYMPH%: 7.4 % — ABNORMAL LOW (ref 14.0–49.0)
MONO#: 0.3 10*3/uL (ref 0.1–0.9)
NEUT#: 9 10*3/uL — ABNORMAL HIGH (ref 1.5–6.5)
NEUT%: 87 % — ABNORMAL HIGH (ref 39.0–75.0)
Platelets: 407 10*3/uL — ABNORMAL HIGH (ref 140–400)
WBC: 10.4 10*3/uL — ABNORMAL HIGH (ref 4.0–10.3)

## 2008-08-20 LAB — MORPHOLOGY: PLT EST: ADEQUATE

## 2008-09-17 LAB — MORPHOLOGY

## 2008-09-17 LAB — CBC WITH DIFFERENTIAL/PLATELET
BASO%: 0.8 % (ref 0.0–2.0)
LYMPH%: 7.3 % — ABNORMAL LOW (ref 14.0–49.0)
MCHC: 32 g/dL (ref 32.0–36.0)
MONO#: 0.4 10*3/uL (ref 0.1–0.9)
MONO%: 3.5 % (ref 0.0–14.0)
Platelets: 389 10*3/uL (ref 140–400)
RBC: 5.47 10*6/uL (ref 4.20–5.82)
WBC: 10.7 10*3/uL — ABNORMAL HIGH (ref 4.0–10.3)

## 2008-10-13 ENCOUNTER — Ambulatory Visit: Payer: Self-pay | Admitting: Oncology

## 2008-11-12 ENCOUNTER — Encounter: Payer: Self-pay | Admitting: Internal Medicine

## 2008-11-12 LAB — MORPHOLOGY: PLT EST: INCREASED

## 2008-11-12 LAB — CBC WITH DIFFERENTIAL/PLATELET
Basophils Absolute: 0 10*3/uL (ref 0.0–0.1)
Eosinophils Absolute: 0.2 10*3/uL (ref 0.0–0.5)
HCT: 45.3 % (ref 38.4–49.9)
HGB: 14.7 g/dL (ref 13.0–17.1)
LYMPH%: 5.6 % — ABNORMAL LOW (ref 14.0–49.0)
MONO#: 0.3 10*3/uL (ref 0.1–0.9)
NEUT#: 11.8 10*3/uL — ABNORMAL HIGH (ref 1.5–6.5)
NEUT%: 90.7 % — ABNORMAL HIGH (ref 39.0–75.0)
Platelets: 544 10*3/uL — ABNORMAL HIGH (ref 140–400)
WBC: 13 10*3/uL — ABNORMAL HIGH (ref 4.0–10.3)

## 2008-12-24 ENCOUNTER — Ambulatory Visit: Payer: Self-pay | Admitting: Internal Medicine

## 2008-12-24 DIAGNOSIS — L03039 Cellulitis of unspecified toe: Secondary | ICD-10-CM

## 2008-12-24 DIAGNOSIS — L02619 Cutaneous abscess of unspecified foot: Secondary | ICD-10-CM | POA: Insufficient documentation

## 2009-01-10 ENCOUNTER — Ambulatory Visit: Payer: Self-pay | Admitting: Oncology

## 2009-01-11 LAB — MORPHOLOGY: PLT EST: ADEQUATE

## 2009-01-11 LAB — CBC WITH DIFFERENTIAL/PLATELET
BASO%: 0.9 % (ref 0.0–2.0)
EOS%: 2.2 % (ref 0.0–7.0)
LYMPH%: 8.8 % — ABNORMAL LOW (ref 14.0–49.0)
MCH: 28.8 pg (ref 27.2–33.4)
MCHC: 31.6 g/dL — ABNORMAL LOW (ref 32.0–36.0)
MCV: 91.1 fL (ref 79.3–98.0)
MONO%: 3.9 % (ref 0.0–14.0)
NEUT#: 8 10*3/uL — ABNORMAL HIGH (ref 1.5–6.5)
Platelets: 318 10*3/uL (ref 140–400)
RBC: 5.39 10*6/uL (ref 4.20–5.82)
RDW: 17.7 % — ABNORMAL HIGH (ref 11.0–14.6)
nRBC: 0 % (ref 0–0)

## 2009-02-20 IMAGING — CR DG CHEST 2V
2 series · 2 of 2 positions shown · non-contrast
Comparison: 01/28/2005.

CLINICAL DATA: 67-year-old male with polycythemia rubra Euripides.

CHEST - 2 VIEW

[view not recorded (1 of 2)]
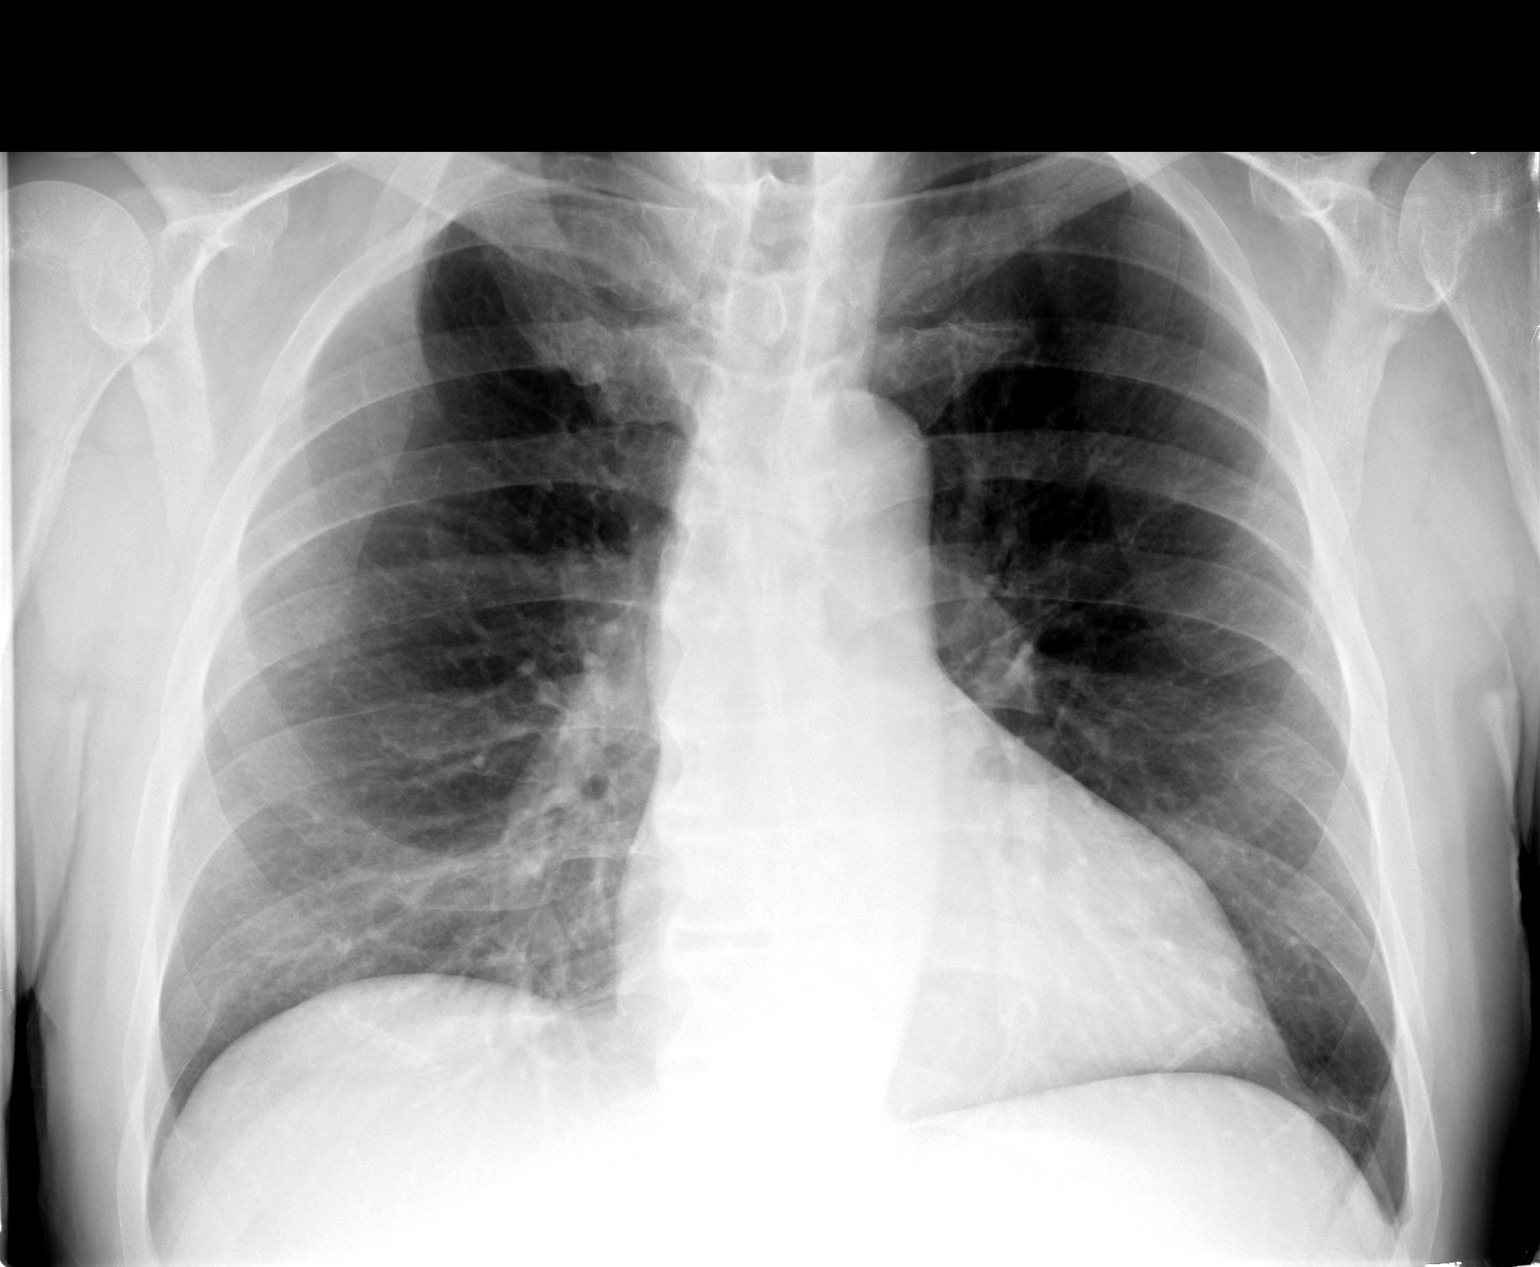

[view not recorded (2 of 2)]
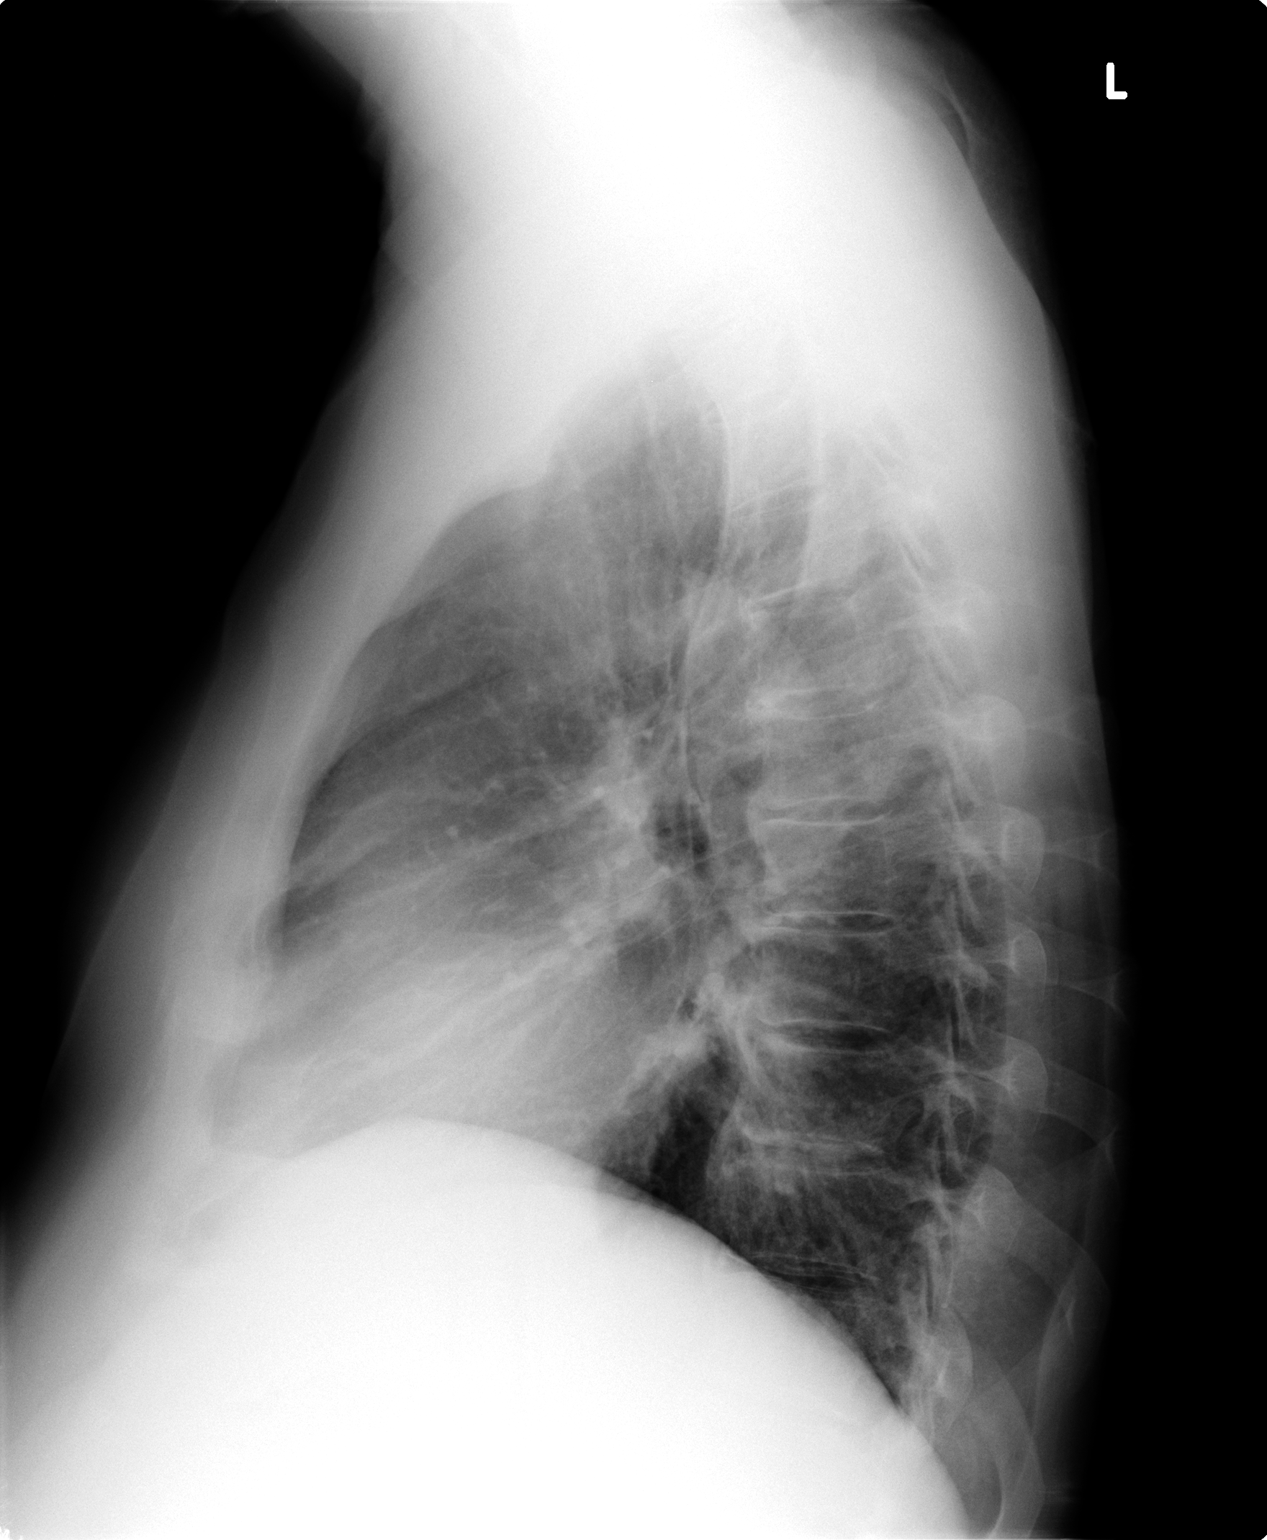

[2 of 2 positions shown; findings below may reference images not displayed]

FINDINGS: Stable cardiac size and mediastinal contours.  Cardiac
size is at the upper limits of normal.  Normal lung volumes.  No
pneumothorax, pulmonary edema or pleural effusion.  Mild patchy
opacity at the right lung base probably reflects scarring, or less
likely subsegmental atelectasis.  No confluent airspace opacity.
Chronic right third rib deformity is stable.  Stable thoracic
osteophytes. No acute osseous abnormality identified.   Tracheal
air column is within normal limits.
IMPRESSION: No acute cardiopulmonary abnormality.

## 2009-03-10 ENCOUNTER — Ambulatory Visit: Payer: Self-pay | Admitting: Oncology

## 2009-03-21 ENCOUNTER — Encounter: Payer: Self-pay | Admitting: Internal Medicine

## 2009-03-21 LAB — CBC WITH DIFFERENTIAL/PLATELET
BASO%: 0 % (ref 0.0–2.0)
EOS%: 0.9 % (ref 0.0–7.0)
MCH: 31.1 pg (ref 27.2–33.4)
MCV: 94.5 fL (ref 79.3–98.0)
MONO%: 1.7 % (ref 0.0–14.0)
NEUT#: 9.2 10*3/uL — ABNORMAL HIGH (ref 1.5–6.5)
RBC: 4.77 10*6/uL (ref 4.20–5.82)
RDW: 20.2 % — ABNORMAL HIGH (ref 11.0–14.6)

## 2009-03-21 LAB — COMPREHENSIVE METABOLIC PANEL
ALT: 19 U/L (ref 0–53)
AST: 26 U/L (ref 0–37)
Albumin: 4.1 g/dL (ref 3.5–5.2)
CO2: 29 mEq/L (ref 19–32)
Calcium: 9 mg/dL (ref 8.4–10.5)
Chloride: 108 mEq/L (ref 96–112)
Creatinine, Ser: 0.98 mg/dL (ref 0.40–1.50)
Potassium: 4.6 mEq/L (ref 3.5–5.3)
Total Protein: 6.3 g/dL (ref 6.0–8.3)

## 2009-03-21 LAB — MORPHOLOGY

## 2009-03-21 LAB — LACTATE DEHYDROGENASE: LDH: 230 U/L (ref 94–250)

## 2009-04-06 ENCOUNTER — Ambulatory Visit: Payer: Self-pay | Admitting: Internal Medicine

## 2009-05-02 ENCOUNTER — Telehealth: Payer: Self-pay | Admitting: Internal Medicine

## 2009-05-03 ENCOUNTER — Ambulatory Visit: Payer: Self-pay | Admitting: Gastroenterology

## 2009-05-11 ENCOUNTER — Ambulatory Visit: Payer: Self-pay | Admitting: Oncology

## 2009-05-13 LAB — MORPHOLOGY: PLT EST: ADEQUATE

## 2009-05-13 LAB — CBC WITH DIFFERENTIAL/PLATELET
BASO%: 0.9 % (ref 0.0–2.0)
EOS%: 1.3 % (ref 0.0–7.0)
HCT: 49.6 % (ref 38.4–49.9)
LYMPH%: 8.3 % — ABNORMAL LOW (ref 14.0–49.0)
MCH: 31.5 pg (ref 27.2–33.4)
MCHC: 31.9 g/dL — ABNORMAL LOW (ref 32.0–36.0)
NEUT%: 86.5 % — ABNORMAL HIGH (ref 39.0–75.0)
lymph#: 1 10*3/uL (ref 0.9–3.3)

## 2009-07-13 ENCOUNTER — Ambulatory Visit: Payer: Self-pay | Admitting: Oncology

## 2009-07-15 LAB — CBC WITH DIFFERENTIAL/PLATELET
BASO%: 1.1 % (ref 0.0–2.0)
MCHC: 32.1 g/dL (ref 32.0–36.0)
MCV: 98.9 fL — ABNORMAL HIGH (ref 79.3–98.0)
MONO#: 0.4 10*3/uL (ref 0.1–0.9)
NEUT#: 9.3 10*3/uL — ABNORMAL HIGH (ref 1.5–6.5)
RDW: 17.3 % — ABNORMAL HIGH (ref 11.0–14.6)
lymph#: 1 10*3/uL (ref 0.9–3.3)
nRBC: 0 % (ref 0–0)

## 2009-07-15 LAB — MORPHOLOGY

## 2009-09-08 ENCOUNTER — Ambulatory Visit: Payer: Self-pay | Admitting: Oncology

## 2009-09-12 LAB — CBC WITH DIFFERENTIAL/PLATELET
BASO%: 1.1 % (ref 0.0–2.0)
Basophils Absolute: 0.1 10*3/uL (ref 0.0–0.1)
EOS%: 1.2 % (ref 0.0–7.0)
Eosinophils Absolute: 0.1 10*3/uL (ref 0.0–0.5)
HCT: 49.5 % (ref 38.4–49.9)
LYMPH%: 7 % — ABNORMAL LOW (ref 14.0–49.0)
MCH: 32.3 pg (ref 27.2–33.4)
MONO#: 0.4 10*3/uL (ref 0.1–0.9)
NEUT%: 87.2 % — ABNORMAL HIGH (ref 39.0–75.0)
RDW: 17.6 % — ABNORMAL HIGH (ref 11.0–14.6)
nRBC: 0 % (ref 0–0)

## 2009-09-12 LAB — MORPHOLOGY: PLT EST: ADEQUATE

## 2009-11-08 ENCOUNTER — Ambulatory Visit: Payer: Self-pay | Admitting: Oncology

## 2009-11-09 ENCOUNTER — Encounter: Payer: Self-pay | Admitting: Internal Medicine

## 2009-11-09 LAB — COMPREHENSIVE METABOLIC PANEL
CO2: 24 mEq/L (ref 19–32)
Glucose, Bld: 109 mg/dL — ABNORMAL HIGH (ref 70–99)
Sodium: 141 mEq/L (ref 135–145)
Total Protein: 6.1 g/dL (ref 6.0–8.3)

## 2009-11-09 LAB — URIC ACID: Uric Acid, Serum: 5.7 mg/dL (ref 4.0–7.8)

## 2009-11-18 ENCOUNTER — Encounter: Payer: Self-pay | Admitting: Internal Medicine

## 2010-01-12 ENCOUNTER — Ambulatory Visit: Payer: Self-pay | Admitting: Oncology

## 2010-01-16 LAB — CBC WITH DIFFERENTIAL/PLATELET
Basophils Absolute: 0.1 10*3/uL (ref 0.0–0.1)
EOS%: 1 % (ref 0.0–7.0)
Eosinophils Absolute: 0.1 10*3/uL (ref 0.0–0.5)
HCT: 52.6 % — ABNORMAL HIGH (ref 38.4–49.9)
HGB: 16.9 g/dL (ref 13.0–17.1)
LYMPH%: 7.4 % — ABNORMAL LOW (ref 14.0–49.0)
MCHC: 32.1 g/dL (ref 32.0–36.0)
MCV: 97.6 fL (ref 79.3–98.0)
NEUT#: 10.1 10*3/uL — ABNORMAL HIGH (ref 1.5–6.5)
Platelets: 276 10*3/uL (ref 140–400)
RBC: 5.39 10*6/uL (ref 4.20–5.82)
WBC: 11.7 10*3/uL — ABNORMAL HIGH (ref 4.0–10.3)
lymph#: 0.9 10*3/uL (ref 0.9–3.3)

## 2010-01-16 LAB — MORPHOLOGY: PLT EST: ADEQUATE

## 2010-03-13 ENCOUNTER — Ambulatory Visit: Payer: Self-pay | Admitting: Oncology

## 2010-03-13 LAB — CBC WITH DIFFERENTIAL/PLATELET
BASO%: 0.7 % (ref 0.0–2.0)
EOS%: 1 % (ref 0.0–7.0)
Eosinophils Absolute: 0.1 10*3/uL (ref 0.0–0.5)
LYMPH%: 7.5 % — ABNORMAL LOW (ref 14.0–49.0)
MCHC: 32.1 g/dL (ref 32.0–36.0)
NEUT#: 10.8 10*3/uL — ABNORMAL HIGH (ref 1.5–6.5)
RDW: 18.1 % — ABNORMAL HIGH (ref 11.0–14.6)
WBC: 12.3 10*3/uL — ABNORMAL HIGH (ref 4.0–10.3)
nRBC: 0 % (ref 0–0)

## 2010-03-13 LAB — MORPHOLOGY: PLT EST: ADEQUATE

## 2010-05-24 ENCOUNTER — Ambulatory Visit: Payer: Self-pay | Admitting: Oncology

## 2010-05-26 ENCOUNTER — Encounter: Payer: Self-pay | Admitting: Internal Medicine

## 2010-05-26 LAB — CBC WITH DIFFERENTIAL/PLATELET
HCT: 47.3 % (ref 38.4–49.9)
MCH: 32.3 pg (ref 27.2–33.4)
MONO%: 1.5 % (ref 0.0–14.0)
NEUT%: 90 % — ABNORMAL HIGH (ref 39.0–75.0)
RBC: 4.82 10*6/uL (ref 4.20–5.82)
RDW: 19.4 % — ABNORMAL HIGH (ref 11.0–14.6)
WBC: 11.1 10*3/uL — ABNORMAL HIGH (ref 4.0–10.3)
lymph#: 0.8 10*3/uL — ABNORMAL LOW (ref 0.9–3.3)

## 2010-05-26 LAB — MORPHOLOGY

## 2010-05-26 LAB — COMPREHENSIVE METABOLIC PANEL
CO2: 26 mEq/L (ref 19–32)
Calcium: 9.2 mg/dL (ref 8.4–10.5)
Chloride: 105 mEq/L (ref 96–112)
Glucose, Bld: 93 mg/dL (ref 70–99)
Potassium: 4.9 mEq/L (ref 3.5–5.3)
Total Protein: 6.5 g/dL (ref 6.0–8.3)

## 2010-06-19 ENCOUNTER — Ambulatory Visit
Admission: RE | Admit: 2010-06-19 | Discharge: 2010-06-19 | Payer: Self-pay | Source: Home / Self Care | Attending: Internal Medicine | Admitting: Internal Medicine

## 2010-06-19 DIAGNOSIS — J069 Acute upper respiratory infection, unspecified: Secondary | ICD-10-CM | POA: Insufficient documentation

## 2010-07-09 LAB — CONVERTED CEMR LAB
BUN: 20 mg/dL (ref 6–23)
CO2: 31 meq/L (ref 19–32)
Calcium: 9.1 mg/dL (ref 8.4–10.5)
Creatinine, Ser: 1 mg/dL (ref 0.4–1.5)
GFR calc Af Amer: 96 mL/min
Glucose, Bld: 91 mg/dL (ref 70–99)
LDL Cholesterol: 105 mg/dL — ABNORMAL HIGH (ref 0–99)
Potassium: 4.9 meq/L (ref 3.5–5.1)
Triglycerides: 81 mg/dL (ref 0–149)

## 2010-07-13 NOTE — Assessment & Plan Note (Signed)
Summary: cold,cough/cd   Vital Signs:  Patient profile:   70 year old male Height:      73 inches Weight:      212 pounds BMI:     28.07 Temp:     99.4 degrees F oral Pulse rate:   84 / minute Pulse rhythm:   regular Resp:     16 per minute BP sitting:   140 / 70  (left arm) Cuff size:   regular  Vitals Entered By: Lanier Prude, CMA(AAMA) (June 19, 2010 1:46 PM) CC: cough, congestion, runny nose, fever X 4 days Is Patient Diabetic? No   Primary Care Provider:  Sonda Primes, MD  CC:  cough, congestion, runny nose, and fever X 4 days.  History of Present Illness: The patient presents with complaints of sore throat, fever, cough, sinus congestion and drainge of several days duration. Not better with OTC meds. Chest hurts with coughing. Can't sleep due to cough. Muscle aches are present.  The mucus is colored.   Current Medications (verified): 1)  Hydroxyurea 500 Mg  Caps (Hydroxyurea) .... Two Times A Day 2)  Prilosec 20 Mg  Cpdr (Omeprazole) .[name].. 1 Tablet By Mouth Once Daily 3)  Flomax 0.4 Mg Xr24h-Cap (Tamsulosin Hcl) .... Once Daily 4)  Vitamin D3 1000 Unit  Tabs (Cholecalciferol) .[name].. 1 By Mouth Daily 5)  Baby Aspirin 81 Mg  Chew (Aspirin) .... Once Daily  Allergies (verified): 1)  ! 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    Physical Exam  General:  alert and well-developed.   Head:  normocephalic and atraumatic.   Eyes:  vision grossly intact, pupils equal, and pupils round.   Nose:  no external deformity Mouth:  Erythematous throat and intranasal mucosa c/w URI  Lungs:  normal respiratory effort and normal  breath sounds.   Heart:  normal rate and regular rhythm.  systolic heart murmur 1/6 Abdomen:  Bowel sounds positive,abdomen soft and non-tender without masses, organomegaly or hernias noted.   Impression & Recommendations:  Problem # 1:  UPPER RESPIRATORY INFECTION, ACUTE (ICD-465.9) Assessment New  The patient presents with complaints of sore throat, fever, cough, sinus congestion and drainge of several days duration. Not better with OTC meds. Chest hurts with coughing. Can't sleep due to cough. Muscle aches are present.  The mucus is colored.  His updated medication list for this problem includes:    Baby Aspirin 81 Mg Chew (Aspirin) ..... Once daily    Tessalon Perles 100 Mg Caps (Benzonatate) .[name]... 1-2 by mouth two times a day as needed cogh    Promethazine-codeine 6.25-10 Mg/64ml Syrp (Promethazine-codeine) .[name]... 5-10 ml by mouth q id as needed cough  Orders: T-2 View CXR, Same Day (71020.5TC)  Complete Medication List: 1)  Hydroxyurea 500 Mg Caps (Hydroxyurea) .... Two times a day 2)  Prilosec 20 Mg Cpdr (Omeprazole) .[name].. 1 tablet by mouth once daily 3)  Flomax 0.4 Mg Xr24h-cap (Tamsulosin hcl) .... Once daily 4)  Vitamin D3 1000 Unit Tabs (Cholecalciferol) .[name].. 1 by mouth daily 5)  Baby Aspirin 81 Mg Chew (Aspirin) .... Once daily 6)  Zithromax Z-pak 250 Mg Tabs (Azithromycin) .... As dirrected 7)  Tessalon Perles 100 Mg Caps (Benzonatate) .[name].. 1-2 by mouth two times a day as needed cogh 8)  Promethazine-codeine 6.25-10 Mg/74ml Syrp (Promethazine-codeine) .... 5-10 ml by mouth q id as needed cough  Patient Instructions: 1)  Call if you are not better in a reasonable amount of time or if worse. Go to ER if feeling really bad!  2)  Use over-the-counter medicines for "cold": Tylenol  650mg  or Advil 400mg  every 6 hours  for fever; Delsym or Robutussin for cough. Mucinex or Mucinex D for congestion. Ricola or Halls for sore throat. Office visit if not better or if worse.    Prescriptions: PROMETHAZINE-CODEINE 6.25-10 MG/5ML SYRP (PROMETHAZINE-CODEINE) 5-10 ml by mouth q id as needed cough  #300 ml x 0   Entered and Authorized by:   Tresa Garter MD   Signed by:   Tresa Garter MD on 06/19/2010   Method used:   Print then Give to Patient   RxID:   [id number] TESSALON PERLES 100 MG CAPS (BENZONATATE) 1-2 by mouth two times a day as needed cogh  #120 x 1   Entered and Authorized by:   Tresa Garter MD   Signed by:   Tresa Garter MD on 06/19/2010   Method used:   Electronically to        [id number] (retail)       [address]       [place], [place]  [address]       Ph: [phone]       Fax: [phone]   RxID:   [id number] ZITHROMAX Z-PAK 250 MG TABS (AZITHROMYCIN) as dirrected  #1 x 0   Entered and Authorized by:   Tresa Garter MD   Signed by:   Tresa Garter MD on 06/19/2010   Method used:   Electronically to        [facility] (retail)       [address]       [place], [place]  [address]       Ph: [phone]       Fax: [phone]   RxID:   [id number] PROMETHAZINE-CODEINE 6.25-10 MG/5ML SYRP (PROMETHAZINE-CODEINE) 5-10 ml by mouth q id as needed cough  #300 ml x 0   Entered and Authorized by:   Tresa Garter MD   Signed by:   Tresa Garter MD on 06/19/2010   Method used:     RxID:   [id number] TESSALON PERLES 100 MG CAPS (BENZONATATE) 1-2 by mouth two times a day as needed cogh  #120 x 1   Entered and Authorized by:   Tresa Garter MD   Signed by:   Tresa Garter MD on 06/19/2010   Method used:     RxID:   [id number] ZITHROMAX Z-PAK 250 MG TABS (AZITHROMYCIN) as dirrected  #1 x 0   Entered and Authorized by:   Tresa Garter MD   Signed by:   Tresa Garter MD on 06/19/2010   Method used:     RxID:   [id number]    Orders Added: 1)  T-2 View CXR, Same Day [71020.5TC] 2)  Est. Patient Level III [38182]

## 2010-07-13 NOTE — Letter (Signed)
[facility]   Imported By: Sherian Rein 12/08/2009 13:29:24  _____________________________________________________________________  External Attachment:    Type:   Image     Comment:   External Document

## 2010-07-13 NOTE — Letter (Signed)
Summary: [facility]  [facility]   Imported By: Lennie Odor 06/08/2010 16:02:36  _____________________________________________________________________  External Attachment:    Type:   Image     Comment:   External Document

## 2010-07-24 ENCOUNTER — Other Ambulatory Visit: Payer: Self-pay | Admitting: Oncology

## 2010-07-24 ENCOUNTER — Encounter (HOSPITAL_BASED_OUTPATIENT_CLINIC_OR_DEPARTMENT_OTHER): Payer: BC Managed Care – PPO | Admitting: Oncology

## 2010-07-24 DIAGNOSIS — D45 Polycythemia vera: Secondary | ICD-10-CM

## 2010-07-24 LAB — CBC WITH DIFFERENTIAL/PLATELET
EOS%: 1.1 % (ref 0.0–7.0)
MCH: 31.8 pg (ref 27.2–33.4)
MCHC: 33.1 g/dL (ref 32.0–36.0)
MCV: 95.9 fL (ref 79.3–98.0)
NEUT#: 11.5 10*3/uL — ABNORMAL HIGH (ref 1.5–6.5)
lymph#: 1.1 10*3/uL (ref 0.9–3.3)

## 2010-09-18 ENCOUNTER — Other Ambulatory Visit: Payer: Self-pay | Admitting: Oncology

## 2010-09-18 ENCOUNTER — Encounter (HOSPITAL_BASED_OUTPATIENT_CLINIC_OR_DEPARTMENT_OTHER): Payer: BC Managed Care – PPO | Admitting: Oncology

## 2010-09-18 DIAGNOSIS — D45 Polycythemia vera: Secondary | ICD-10-CM

## 2010-09-18 LAB — CBC WITH DIFFERENTIAL/PLATELET
EOS%: 0.9 % (ref 0.0–7.0)
Eosinophils Absolute: 0.1 10*3/uL (ref 0.0–0.5)
HGB: 16.7 g/dL (ref 13.0–17.1)
LYMPH%: 9.1 % — ABNORMAL LOW (ref 14.0–49.0)
MCV: 97.4 fL (ref 79.3–98.0)
MONO#: 0.2 10*3/uL (ref 0.1–0.9)
MONO%: 1.2 % (ref 0.0–14.0)
RBC: 5.1 10*6/uL (ref 4.20–5.82)
WBC: 14.3 10*3/uL — ABNORMAL HIGH (ref 4.0–10.3)
lymph#: 1.3 10*3/uL (ref 0.9–3.3)

## 2010-09-22 ENCOUNTER — Encounter: Payer: BC Managed Care – PPO | Admitting: Oncology

## 2010-10-04 ENCOUNTER — Encounter: Payer: Self-pay | Admitting: Internal Medicine

## 2010-10-04 ENCOUNTER — Ambulatory Visit (INDEPENDENT_AMBULATORY_CARE_PROVIDER_SITE_OTHER): Payer: BC Managed Care – PPO | Admitting: Internal Medicine

## 2010-10-04 VITALS — BP 124/68 | HR 60 | Ht 73.0 in | Wt 216.0 lb

## 2010-10-04 DIAGNOSIS — K219 Gastro-esophageal reflux disease without esophagitis: Secondary | ICD-10-CM

## 2010-10-04 DIAGNOSIS — Z86010 Personal history of colonic polyps: Secondary | ICD-10-CM

## 2010-10-04 DIAGNOSIS — Z8711 Personal history of peptic ulcer disease: Secondary | ICD-10-CM

## 2010-10-04 MED ORDER — OMEPRAZOLE 20 MG PO CPDR: 20.0000 mg | DELAYED_RELEASE_CAPSULE | Freq: Every day | ORAL | Status: DC

## 2010-10-04 NOTE — Progress Notes (Signed)
HISTORY OF PRESENT ILLNESS:  Mark Branch is a 70 y.o. male with a history of polycythemia vera and BPH who is followed in this office for GERD, history of bleeding ulcers, and adenomatous colon polyps. Last evaluated November 2010. Since that time he has done well. He continues on Prilosec 20 mg daily. He denies heartburn, dysphagia, abdominal pain, recurrent bleeding. His last colonoscopy was in 2007. He is due for followup around October 2012. No lower GI complaints. Chronic medical problems stable.  REVIEW OF SYSTEMS:  All non-GI ROS negative.  Past Medical History  Diagnosis Date  . History of colonic polyps   . Polycythemia vera   . BPH (benign prostatic hyperplasia)   . GERD (gastroesophageal reflux disease)     Past Surgical History  Procedure Date  . Appendectomy     Social History Mark Branch  reports that he has quit smoking. He does not have any smokeless tobacco history on file. He reports that he drinks alcohol. He reports that he does not use illicit drugs.  family history includes Colon cancer in his mother and Hypertension in an unspecified family member.  Allergies  Allergen Reactions  . Sulfonamide Derivatives        PHYSICAL EXAMINATION:  Vital signs: BP 124/68  Pulse 60  Ht 6\' 1"  (1.854 m)  Wt 216 lb (97.977 kg)  BMI 28.50 kg/m2 General: Well-developed, well-nourished, no acute distress HEENT: Sclerae are anicteric, conjunctiva pink. Oral mucosa intact Lungs: Clear Heart: Regular Abdomen: soft, nontender, nondistended, no obvious ascites, no peritoneal signs, normal bowel sounds. No organomegaly. Extremities: No edema Psychiatric: alert and oriented x3. Cooperative   ASSESSMENT:  #1. GERD. Stable on PPI #2. History of bleeding duodenal ulcer. Remains on PPI with concurrent aspirin use. No recurrence #3. History of adenomatous colon polyps. Due for surveillance October 2012   PLAN:  #1. Refill Prilosec 20 mg daily #2. Avoid  unnecessary NSAIDs #3. Keep surveillance colonoscopy anniversary date

## 2010-10-04 NOTE — Patient Instructions (Signed)
Refilled Omeprazole 20 mg #30 x 11 RFs. Follow-up as needed.

## 2010-11-13 ENCOUNTER — Other Ambulatory Visit: Payer: Self-pay | Admitting: Oncology

## 2010-11-13 ENCOUNTER — Encounter (HOSPITAL_BASED_OUTPATIENT_CLINIC_OR_DEPARTMENT_OTHER): Payer: BC Managed Care – PPO | Admitting: Oncology

## 2010-11-13 DIAGNOSIS — D45 Polycythemia vera: Secondary | ICD-10-CM

## 2010-11-13 LAB — CBC WITH DIFFERENTIAL/PLATELET
BASO%: 1 % (ref 0.0–2.0)
HCT: 48.1 % (ref 38.4–49.9)
HGB: 15.4 g/dL (ref 13.0–17.1)
MCHC: 32 g/dL (ref 32.0–36.0)
MCV: 97 fL (ref 79.3–98.0)
MONO#: 0.5 10*3/uL (ref 0.1–0.9)
NEUT%: 83.9 % — ABNORMAL HIGH (ref 39.0–75.0)
RBC: 4.96 10*6/uL (ref 4.20–5.82)
RDW: 18.2 % — ABNORMAL HIGH (ref 11.0–14.6)
WBC: 13.5 10*3/uL — ABNORMAL HIGH (ref 4.0–10.3)
lymph#: 1.4 10*3/uL (ref 0.9–3.3)
nRBC: 0 % (ref 0–0)

## 2010-11-20 ENCOUNTER — Encounter: Payer: Self-pay | Admitting: Internal Medicine

## 2010-11-20 ENCOUNTER — Ambulatory Visit (INDEPENDENT_AMBULATORY_CARE_PROVIDER_SITE_OTHER): Payer: BC Managed Care – PPO | Admitting: Internal Medicine

## 2010-11-20 DIAGNOSIS — J069 Acute upper respiratory infection, unspecified: Secondary | ICD-10-CM

## 2010-11-20 NOTE — Assessment & Plan Note (Signed)
Avelox 400 mg qd x 6 d samples He declined CXR

## 2010-11-20 NOTE — Progress Notes (Signed)
  Subjective:    Patient ID: Mark Branch, male    DOB: 25-Dec-1940, 70 y.o.   MRN: [id number]  HPI    HPI  C/o URI sx's x   14 days. C/o ST, cough, weakness. Not better with OTC medicines. Actually, the patient is getting worse. The patient did not sleep last night due to cough.  Review of Systems  Constitutional: Positive for fever, chills and fatigue.  HENT: Positive for congestion, rhinorrhea, sneezing and postnasal drip.   Eyes: Positive for photophobia and pain. Negative for discharge and visual disturbance.  Respiratory: Positive for cough and wheezing.   Positive for chest pain.  Gastrointestinal: Negative for vomiting, abdominal pain, diarrhea and abdominal distention.  Genitourinary: Negative for dysuria and difficulty urinating.  Skin: Negative for rash.  Neurological: Positive for dizziness, weakness and light-headedness.     Review of Systems     Objective:   Physical Exam        Assessment & Plan:

## 2010-11-20 NOTE — Patient Instructions (Signed)
AVELOX 400 mg 1 a day x 6 day

## 2011-01-08 ENCOUNTER — Encounter (HOSPITAL_BASED_OUTPATIENT_CLINIC_OR_DEPARTMENT_OTHER): Payer: BC Managed Care – PPO | Admitting: Oncology

## 2011-01-08 ENCOUNTER — Other Ambulatory Visit: Payer: Self-pay | Admitting: Oncology

## 2011-01-08 DIAGNOSIS — D45 Polycythemia vera: Secondary | ICD-10-CM

## 2011-01-08 LAB — CBC WITH DIFFERENTIAL/PLATELET
Basophils Absolute: 0.1 10*3/uL (ref 0.0–0.1)
Eosinophils Absolute: 0.1 10*3/uL (ref 0.0–0.5)
HCT: 50.2 % — ABNORMAL HIGH (ref 38.4–49.9)
HGB: 16.6 g/dL (ref 13.0–17.1)
LYMPH%: 9.5 % — ABNORMAL LOW (ref 14.0–49.0)
MONO#: 0.4 10*3/uL (ref 0.1–0.9)
NEUT#: 10.7 10*3/uL — ABNORMAL HIGH (ref 1.5–6.5)
NEUT%: 85.5 % — ABNORMAL HIGH (ref 39.0–75.0)
Platelets: 240 10*3/uL (ref 140–400)
WBC: 12.5 10*3/uL — ABNORMAL HIGH (ref 4.0–10.3)
nRBC: 0 % (ref 0–0)

## 2011-03-05 ENCOUNTER — Encounter (HOSPITAL_BASED_OUTPATIENT_CLINIC_OR_DEPARTMENT_OTHER): Payer: BC Managed Care – PPO | Admitting: Oncology

## 2011-03-05 ENCOUNTER — Other Ambulatory Visit: Payer: Self-pay | Admitting: Oncology

## 2011-03-05 DIAGNOSIS — D45 Polycythemia vera: Secondary | ICD-10-CM

## 2011-03-05 LAB — CBC WITH DIFFERENTIAL/PLATELET
Eosinophils Absolute: 0.2 10*3/uL (ref 0.0–0.5)
HCT: 49.3 % (ref 38.4–49.9)
LYMPH%: 11.1 % — ABNORMAL LOW (ref 14.0–49.0)
MCHC: 32.5 g/dL (ref 32.0–36.0)
MONO#: 0.4 10*3/uL (ref 0.1–0.9)
NEUT#: 10.1 10*3/uL — ABNORMAL HIGH (ref 1.5–6.5)
NEUT%: 84 % — ABNORMAL HIGH (ref 39.0–75.0)
Platelets: 220 10*3/uL (ref 140–400)
WBC: 12 10*3/uL — ABNORMAL HIGH (ref 4.0–10.3)

## 2011-03-19 ENCOUNTER — Ambulatory Visit (INDEPENDENT_AMBULATORY_CARE_PROVIDER_SITE_OTHER): Payer: BC Managed Care – PPO

## 2011-03-19 DIAGNOSIS — Z23 Encounter for immunization: Secondary | ICD-10-CM

## 2011-04-03 ENCOUNTER — Encounter: Payer: Self-pay | Admitting: Internal Medicine

## 2011-04-04 ENCOUNTER — Encounter: Payer: Self-pay | Admitting: Internal Medicine

## 2011-04-28 ENCOUNTER — Telehealth: Payer: Self-pay | Admitting: Oncology

## 2011-04-28 NOTE — Telephone Encounter (Signed)
Talked to pt , gave him appt for January, a r/s from November due to Epic. Reminded pt of appt on Nov 19th

## 2011-04-30 ENCOUNTER — Other Ambulatory Visit (HOSPITAL_BASED_OUTPATIENT_CLINIC_OR_DEPARTMENT_OTHER): Payer: BC Managed Care – PPO

## 2011-04-30 ENCOUNTER — Other Ambulatory Visit: Payer: Self-pay | Admitting: Oncology

## 2011-04-30 DIAGNOSIS — D45 Polycythemia vera: Secondary | ICD-10-CM

## 2011-04-30 LAB — CBC WITH DIFFERENTIAL/PLATELET
Basophils Absolute: 0.1 10*3/uL (ref 0.0–0.1)
Eosinophils Absolute: 0.1 10*3/uL (ref 0.0–0.5)
HCT: 47.7 % (ref 38.4–49.9)
HGB: 15.4 g/dL (ref 13.0–17.1)
LYMPH%: 10.7 % — ABNORMAL LOW (ref 14.0–49.0)
MCV: 97 fL (ref 79.3–98.0)
MONO%: 2.4 % (ref 0.0–14.0)
NEUT#: 9.5 10*3/uL — ABNORMAL HIGH (ref 1.5–6.5)
Platelets: 234 10*3/uL (ref 140–400)

## 2011-04-30 LAB — TECHNOLOGIST REVIEW

## 2011-05-02 ENCOUNTER — Telehealth: Payer: Self-pay | Admitting: *Deleted

## 2011-05-02 ENCOUNTER — Other Ambulatory Visit: Payer: Self-pay | Admitting: *Deleted

## 2011-05-02 DIAGNOSIS — D45 Polycythemia vera: Secondary | ICD-10-CM

## 2011-05-02 NOTE — Telephone Encounter (Signed)
Pt notified to stay on same dose of hydrea = 1000mg  daily & to repeat cbc q 2 mo per Dr. Cyndie Chime.

## 2011-05-04 ENCOUNTER — Telehealth: Payer: Self-pay | Admitting: Oncology

## 2011-05-04 NOTE — Telephone Encounter (Signed)
Called pt and left message appt for 1/8 cancelled moved q2 mos per MD.

## 2011-06-01 ENCOUNTER — Encounter: Payer: Self-pay | Admitting: Internal Medicine

## 2011-06-14 IMAGING — CR DG CHEST 2V
2 series · 2 of 2 positions shown · non-contrast
Comparison: 02/26/2008

CLINICAL DATA: Cough, follow up pneumonia

CHEST - 2 VIEW

[view not recorded (1 of 2)]
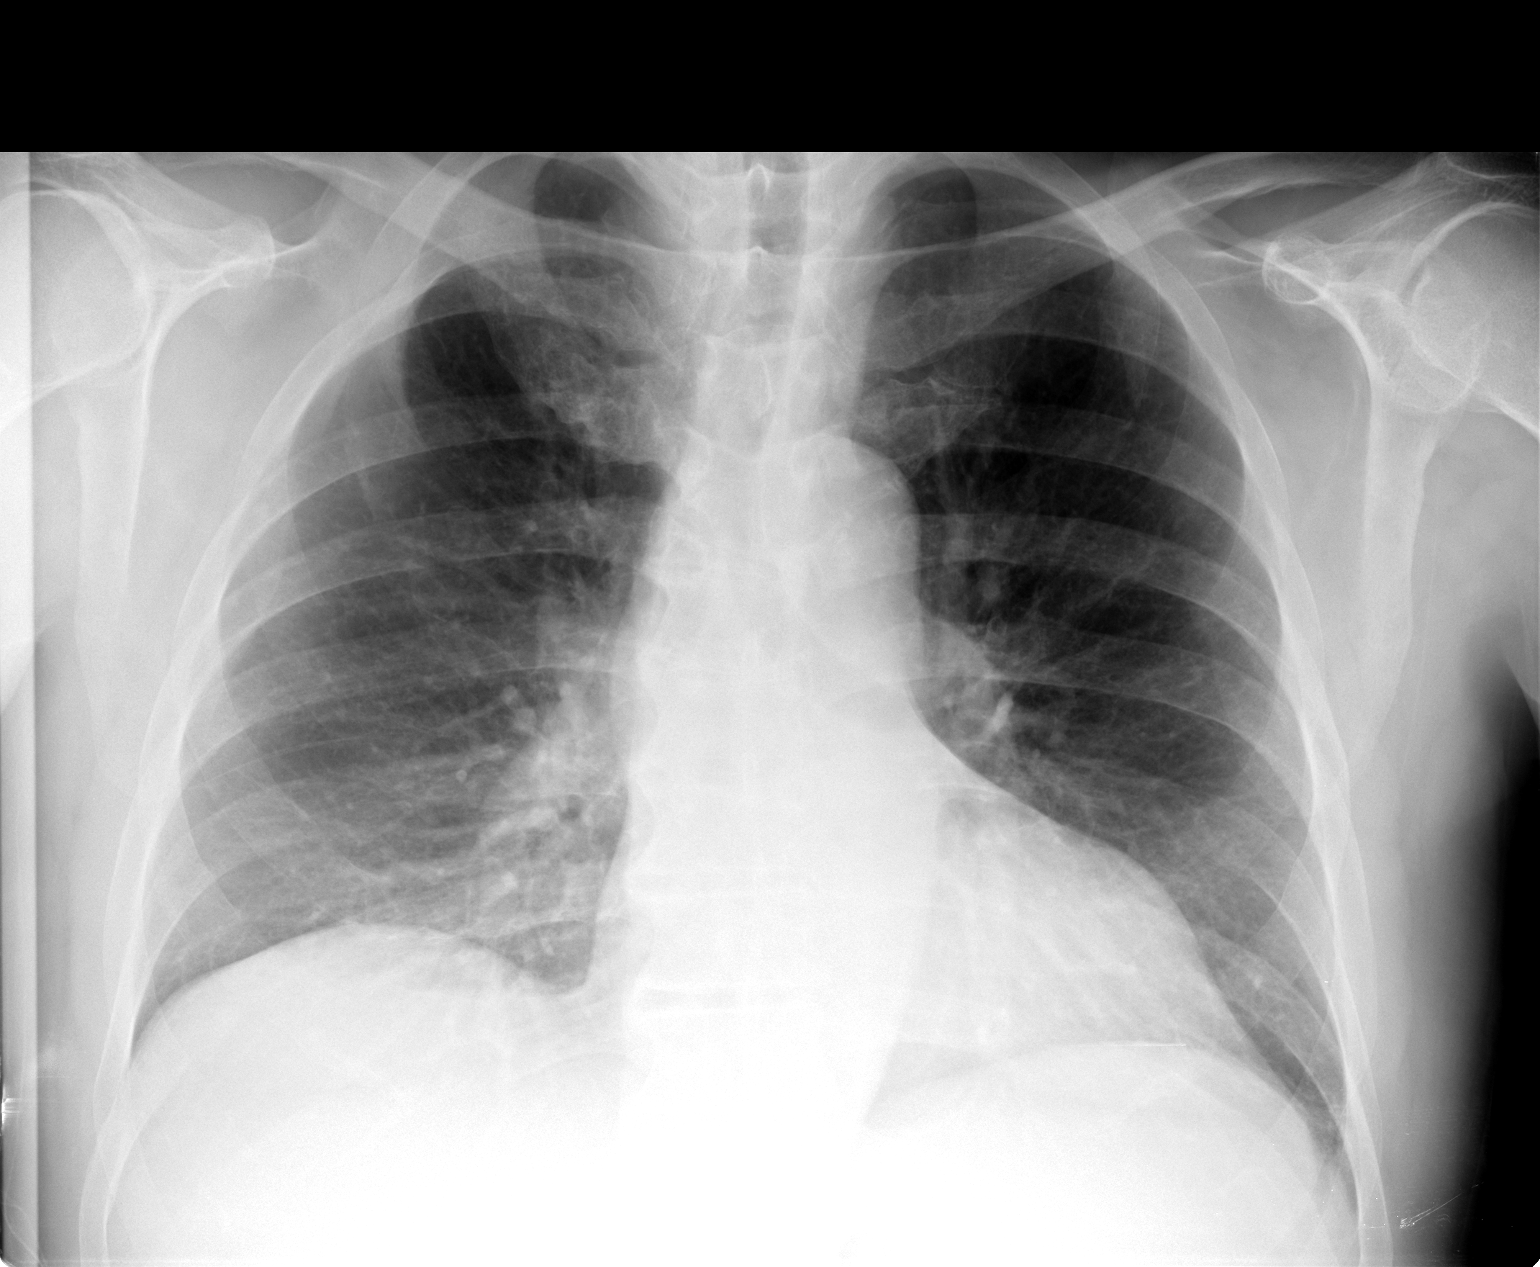

[view not recorded (2 of 2)]
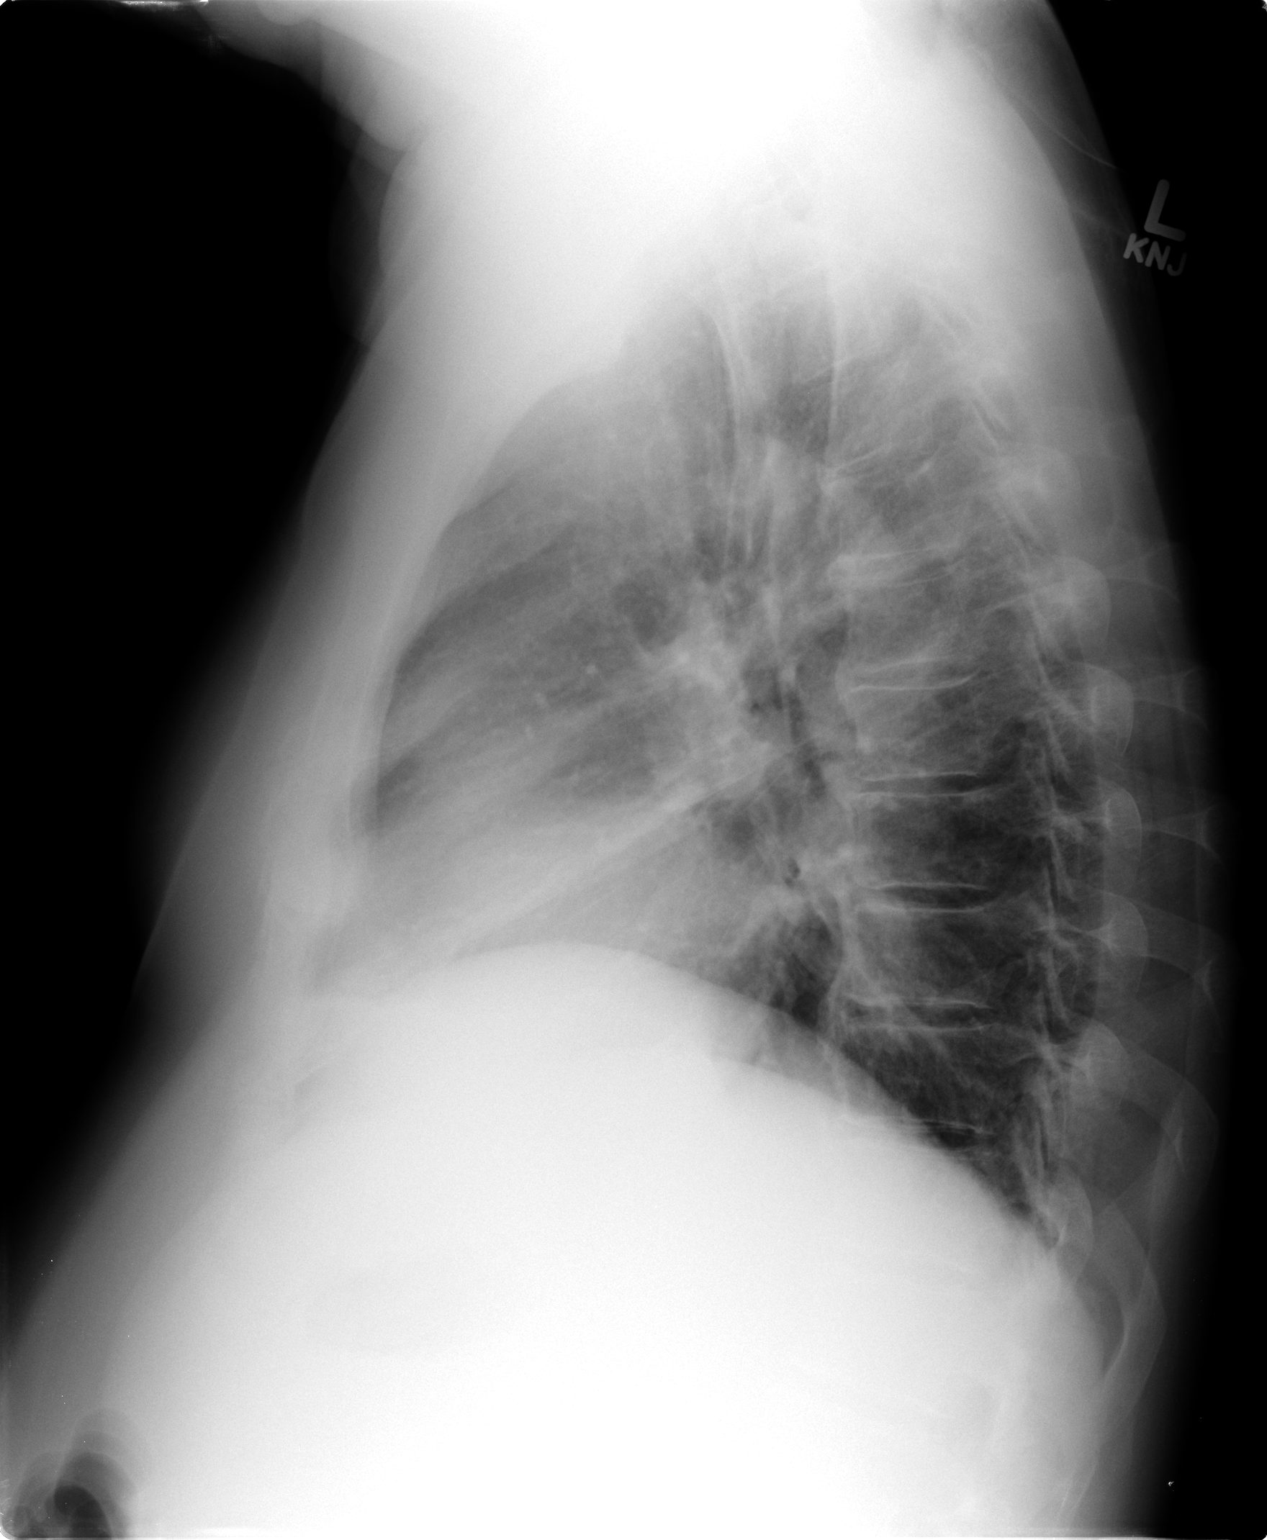

[2 of 2 positions shown; findings below may reference images not displayed]

FINDINGS: Heart size upper normal.  Negative for heart failure.
Negative for pneumonia or effusion.

Deformity of the right second and third ribs, unchanged and
chronic.
IMPRESSION: No active cardiopulmonary disease.

## 2011-06-19 ENCOUNTER — Ambulatory Visit: Payer: BC Managed Care – PPO | Admitting: Oncology

## 2011-06-26 ENCOUNTER — Ambulatory Visit (AMBULATORY_SURGERY_CENTER): Payer: BC Managed Care – PPO

## 2011-06-26 ENCOUNTER — Encounter: Payer: Self-pay | Admitting: Internal Medicine

## 2011-06-26 VITALS — Ht 73.0 in | Wt 218.9 lb

## 2011-06-26 DIAGNOSIS — Z800 Family history of malignant neoplasm of digestive organs: Secondary | ICD-10-CM

## 2011-06-26 DIAGNOSIS — Z860100 Personal history of colon polyps, unspecified: Secondary | ICD-10-CM

## 2011-06-26 MED ORDER — PEG-KCL-NACL-NASULF-NA ASC-C 100 G PO SOLR: 1.0000 | Freq: Once | ORAL | Status: AC

## 2011-07-04 ENCOUNTER — Other Ambulatory Visit (HOSPITAL_BASED_OUTPATIENT_CLINIC_OR_DEPARTMENT_OTHER): Payer: BC Managed Care – PPO | Admitting: Lab

## 2011-07-04 ENCOUNTER — Telehealth: Payer: Self-pay | Admitting: *Deleted

## 2011-07-04 DIAGNOSIS — D45 Polycythemia vera: Secondary | ICD-10-CM

## 2011-07-04 LAB — CBC WITH DIFFERENTIAL/PLATELET
BASO%: 0.5 % (ref 0.0–2.0)
HCT: 49.4 % (ref 38.4–49.9)
MCHC: 33.7 g/dL (ref 32.0–36.0)
MONO#: 0.1 10*3/uL (ref 0.1–0.9)
RBC: 4.96 10*6/uL (ref 4.20–5.82)
WBC: 13.5 10*3/uL — ABNORMAL HIGH (ref 4.0–10.3)
lymph#: 1.2 10*3/uL (ref 0.9–3.3)

## 2011-07-04 NOTE — Telephone Encounter (Signed)
Rf req for Allopurinol. Med is not on active med list. Please advise.

## 2011-07-04 NOTE — Telephone Encounter (Signed)
OK to fill this prescription with additional refills x12 Thank you!  

## 2011-07-05 ENCOUNTER — Telehealth: Payer: Self-pay | Admitting: Internal Medicine

## 2011-07-05 DIAGNOSIS — Z800 Family history of malignant neoplasm of digestive organs: Secondary | ICD-10-CM

## 2011-07-05 DIAGNOSIS — Z86010 Personal history of colonic polyps: Secondary | ICD-10-CM

## 2011-07-05 MED ORDER — PEG-KCL-NACL-NASULF-NA ASC-C 100 G PO SOLR: ORAL | Status: DC

## 2011-07-05 MED ORDER — ALLOPURINOL 300 MG PO TABS: 300.0000 mg | ORAL_TABLET | Freq: Every day | ORAL | Status: DC

## 2011-07-05 NOTE — Telephone Encounter (Signed)
Moviprep sent to [facility] on [address] per pt request.  LMOM that rx has been sent

## 2011-07-05 NOTE — Telephone Encounter (Signed)
300 mg 1 qd # 30

## 2011-07-06 ENCOUNTER — Telehealth: Payer: Self-pay | Admitting: *Deleted

## 2011-07-06 ENCOUNTER — Other Ambulatory Visit: Payer: Self-pay | Admitting: *Deleted

## 2011-07-06 NOTE — Telephone Encounter (Signed)
Left message with pt's wife to have pt call scheduler to set up phlebotomy

## 2011-07-10 ENCOUNTER — Encounter: Payer: BC Managed Care – PPO | Admitting: Internal Medicine

## 2011-07-12 ENCOUNTER — Other Ambulatory Visit: Payer: Self-pay | Admitting: Oncology

## 2011-07-12 ENCOUNTER — Telehealth: Payer: Self-pay | Admitting: *Deleted

## 2011-07-12 ENCOUNTER — Other Ambulatory Visit: Payer: Self-pay | Admitting: Certified Registered Nurse Anesthetist

## 2011-07-12 DIAGNOSIS — D45 Polycythemia vera: Secondary | ICD-10-CM

## 2011-07-12 NOTE — Telephone Encounter (Signed)
Received vm call from pt stating that he needs a copy of his records/history b/c he taking his wife to a nursing home in [place], [place] & wants to know if he can move his phlebotomy from mon 07/16/11 to tomorrow 07/13/11 or delay phlebotomy for 2-3wks.  Michelle/Scheduler was able to move appt to 12 noon tomorrow & message left for pt to call back to discuss appt & to explain why he needs records on himself.  Pt called back & states he has bought a place in [place] for himself & his wife although they will be separate & they want his records there.  Will discuss with Medical Records & pt can come by to sign release of info form tomorrow when he comes for phelbotomy.

## 2011-07-13 ENCOUNTER — Ambulatory Visit (HOSPITAL_BASED_OUTPATIENT_CLINIC_OR_DEPARTMENT_OTHER): Payer: BC Managed Care – PPO

## 2011-07-13 DIAGNOSIS — D45 Polycythemia vera: Secondary | ICD-10-CM

## 2011-07-13 NOTE — Progress Notes (Signed)
Phlebotomy of 550cc completed. L antecubital site. Started 1215 and finished 1220. Pt tolerated well. Observation for 25 minutes. Drink taken.

## 2011-07-24 ENCOUNTER — Ambulatory Visit (AMBULATORY_SURGERY_CENTER): Payer: BC Managed Care – PPO | Admitting: Internal Medicine

## 2011-07-24 ENCOUNTER — Encounter: Payer: Self-pay | Admitting: Internal Medicine

## 2011-07-24 VITALS — BP 138/72 | HR 72 | Resp 16 | Ht 73.0 in | Wt 218.0 lb

## 2011-07-24 DIAGNOSIS — Z860100 Personal history of colon polyps, unspecified: Secondary | ICD-10-CM

## 2011-07-24 DIAGNOSIS — D126 Benign neoplasm of colon, unspecified: Secondary | ICD-10-CM

## 2011-07-24 DIAGNOSIS — Z1211 Encounter for screening for malignant neoplasm of colon: Secondary | ICD-10-CM

## 2011-07-24 DIAGNOSIS — Z800 Family history of malignant neoplasm of digestive organs: Secondary | ICD-10-CM

## 2011-07-24 DIAGNOSIS — K5730 Diverticulosis of large intestine without perforation or abscess without bleeding: Secondary | ICD-10-CM

## 2011-07-24 MED ORDER — SODIUM CHLORIDE 0.9 % IV SOLN: 500.0000 mL | INTRAVENOUS | Status: DC

## 2011-07-24 NOTE — Patient Instructions (Signed)
Discharge instructions per blue and neon green sheets  Handouts on polyps and diverticulosis and high fiber diet  Repeat colonoscopy in 5 years, 2018-we will mail you a letter in 1-2 weeks with the pathology results from the polyps and dr perry's recommendations. We will also mail you a reminder letter in 5 years for the repeat colonoscopy.

## 2011-07-24 NOTE — Progress Notes (Signed)
Patient did not experience any of the following events: a burn prior to discharge; a fall within the facility; wrong site/side/patient/procedure/implant event; or a hospital transfer or hospital admission upon discharge from the facility. (G8907) Patient did not have preoperative order for IV antibiotic SSI prophylaxis. (G8918)  

## 2011-07-24 NOTE — Op Note (Signed)
[facility] [address] [place], [place]  [address]  COLONOSCOPY PROCEDURE REPORT  PATIENT:  Mark Branch, Mark Branch  MR#:  [id number] BIRTHDATE:  1941-05-30, 70 yrs. old  GENDER:  male ENDOSCOPIST:  Wilhemina Bonito. Eda Keys, MD REF. BY:  Surveillance Program Recall, PROCEDURE DATE:  07/24/2011 PROCEDURE:  Colonoscopy with snare polypectomy x 2 ASA CLASS:  Class II INDICATIONS:  history of pre-cancerous (adenomatous) colon polyps, surveillance and high-risk screening ; prior exams 1998, 2000, 2004, 2007 w/ TAs MEDICATIONS:   MAC sedation, administered by CRNA, propofol (Diprivan) 300 mg IV  DESCRIPTION OF PROCEDURE:   After the risks benefits and alternatives of the procedure were thoroughly explained, informed consent was obtained.  Digital rectal exam was performed and revealed no abnormalities.   The LB CF-[id number] endoscope was introduced through the anus and advanced to the cecum, which was identified by both the appendix and ileocecal valve, without limitations.  The quality of the prep was excellent, using MoviPrep.  The instrument was then slowly withdrawn as the colon was fully examined. <<PROCEDUREIMAGES>>  FINDINGS:  Two 5mm  polyps were found in the ascending colon and snared without cautery. Retrieval was successful.  Moderate diverticulosis was found in the sigmoid colon.  Otherwise normal colonoscopy without other polyps, masses, vascular ectasias, or inflammatory changes.   Retroflexed views in the rectum revealed no abnormalities.    The time to cecum = 2:47  minutes. The scope was then withdrawn in 10:47  minutes from the cecum and the procedure completed.  COMPLICATIONS:  None  ENDOSCOPIC IMPRESSION: 1) Two polyps in the ascending colon - removed 2) Moderate diverticulosis in the sigmoid colon 3) Otherwise normal colonoscopy  RECOMMENDATIONS: 1) Follow up colonoscopy in 5 years  ______________________________ Wilhemina Bonito. Eda Keys, MD  CC:  Linda Hedges.  Plotnikov, MD; The Patient  n. eSIGNED:   Wilhemina Bonito. Eda Keys at 07/24/2011 12:27 PM  Lillia Corporal, [phone]

## 2011-07-25 ENCOUNTER — Telehealth: Payer: Self-pay | Admitting: *Deleted

## 2011-07-25 NOTE — Telephone Encounter (Signed)
No answer, message left

## 2011-07-31 ENCOUNTER — Encounter: Payer: Self-pay | Admitting: Internal Medicine

## 2011-08-06 ENCOUNTER — Ambulatory Visit (HOSPITAL_BASED_OUTPATIENT_CLINIC_OR_DEPARTMENT_OTHER): Payer: BC Managed Care – PPO | Admitting: Oncology

## 2011-08-06 VITALS — BP 142/72 | HR 64 | Temp 97.1°F | Ht 73.0 in | Wt 207.5 lb

## 2011-08-06 DIAGNOSIS — D45 Polycythemia vera: Secondary | ICD-10-CM

## 2011-08-06 NOTE — Progress Notes (Signed)
Hematology and Oncology Follow Up Visit  Mark Branch [id number] 22-Feb-1941 71 y.o. 08/06/2011 8:07 PM   Principle Diagnosis: Encounter Diagnosis  Name Primary?  Mark Branch Kitchen POLYCYTHEMIA RUBRA VERA Yes     Interim History:   Followup visit for this 71 year old man with long-standing polycythemia vera. He was initially diagnosed in December of 1991. He was on a phlebotomy program on a regular basis until April of 1998 when he had an upper GI bleed do to peptic ulcer disease. He has required minimal phlebotomy since that time. He was started on Hydrea in 2002 when platelet count began to elevate to an unacceptable level. Current dose 500 mg daily. He has had no interim medical problems. Unfortunately, his wife who has lung cancer metastatic to brain and is currently in remission, developed complications of previous brain radiation and now has significant ataxia. She had to be placed in a nursing facility in [facility].  Medications: reviewed  Allergies:  Allergies  Allergen Reactions  . Sulfonamide Derivatives     Review of Systems: Constitutional:   No constitutional symptoms Respiratory: No cough or dyspnea. He did have a recent bronchitis which has resolved. Cardiovascular:  Denies any chest pain or palpitations Gastrointestinal: No abdominal pain or change in bowel habit no hematochezia or melena Genito-Urinary: No urinary tract symptoms Musculoskeletal: No muscle or bone pain Neurologic: No headache or change in vision Skin: No rash or ecchymosis Remaining ROS negative.  Physical Exam: Blood pressure 142/72, pulse 64, temperature 97.1 F (36.2 C), temperature source Oral, height 6\' 1"  (1.854 m), weight 207 lb 8 oz (94.121 kg). Wt Readings from Last 3 Encounters:  08/06/11 207 lb 8 oz (94.121 kg)  07/24/11 218 lb (98.884 kg)  06/26/11 218 lb 14.4 oz (99.292 kg)     General appearance: Ruddy complexion HENNT: Pharynx no erythema or exudate Lymph nodes: No  adenopathy Breasts: Lungs: Clear to auscultation resonant to percussion Heart: Regular cardiac rhythm no murmur Abdomen: Soft nontender no mass; organomegaly: Spleen enlarged about 8 cm below left costal margin Extremities: No edema no calf tenderness Vascular: No cyanosis Neurologic: Pupils equal round reactive to light optic discs sharp vessels normal motor strength 5 over 5 coordination and gait normal Skin: No rash or ecchymosis  Lab Results: Lab Results  Component Value Date   WBC 13.5* 07/04/2011   HGB 16.6 07/04/2011   HCT 49.4 07/04/2011   MCV 99.5* 07/04/2011   PLT 221 07/04/2011     Chemistry      Component Value Date/Time   NA 142 05/26/2010 1432   K 4.9 05/26/2010 1432   CL 105 05/26/2010 1432   CO2 26 05/26/2010 1432   BUN 20 05/26/2010 1432   CREATININE 1.10 05/26/2010 1432      Component Value Date/Time   CALCIUM 9.2 05/26/2010 1432   ALKPHOS 61 05/26/2010 1432   AST 26 05/26/2010 1432   ALT 17 05/26/2010 1432   BILITOT 2.3* 05/26/2010 1432       Impression and Plan: #1. Polycythemia vera. We reviewed recent data out of [place] published in the January 2013 [place] Journal of Medicine which looked at optimal level to maintain hematocrit to minimize thrombotic complications of polycythemia vera. There were significantly less complications when hematocrits were maintained in the 40-45% range rather than 45-50%. There may be some compounding influences on the thrombotic tendency including the level of the white count. I have kept him at around 50% for many years and we have been fortunate.  He has had no thrombotic events. He is still taking low-dose aspirin as well as the Hydrea. He did agree to be more aggressive with his phlebotomy program. We discussed his concern with respect to possible leukemic transformation of polycythemia vera. This is possible but remains quite low less than 5% of patients. We discussed the more common progression of polycythemia to  myelofibrosis with progressive pancytopenia. We have not seen any trend for this in Mark Branch  #2. Benign prostatic hyperplasia. He remains on Flomax. Recent rectal exam as part of his annual physical by his internist.  #3. Peptic ulcer disease with history of GI bleed. No recent events. No current symptoms. He remains on Prilosec.  #4. Gout. Currently not active. He remains on allopurinol.   CC:. Dr. Jola Branch   Mark Feinstein, MD 2/25/20138:07 PM

## 2011-08-09 ENCOUNTER — Telehealth: Payer: Self-pay | Admitting: Oncology

## 2011-08-09 NOTE — Telephone Encounter (Signed)
Called pt, left message regarding appt , lab monthly and MD visit in August 2013, also mailed appt today

## 2011-09-05 ENCOUNTER — Other Ambulatory Visit (HOSPITAL_BASED_OUTPATIENT_CLINIC_OR_DEPARTMENT_OTHER): Payer: BC Managed Care – PPO | Admitting: Lab

## 2011-09-05 ENCOUNTER — Telehealth: Payer: Self-pay | Admitting: *Deleted

## 2011-09-05 DIAGNOSIS — D45 Polycythemia vera: Secondary | ICD-10-CM

## 2011-09-05 LAB — MORPHOLOGY: PLT EST: ADEQUATE

## 2011-09-05 LAB — CBC WITH DIFFERENTIAL/PLATELET
Basophils Absolute: 0.1 10*3/uL (ref 0.0–0.1)
Eosinophils Absolute: 0.1 10*3/uL (ref 0.0–0.5)
HCT: 44.6 % (ref 38.4–49.9)
HGB: 14.8 g/dL (ref 13.0–17.1)
LYMPH%: 12.6 % — ABNORMAL LOW (ref 14.0–49.0)
MCV: 102.1 fL — ABNORMAL HIGH (ref 79.3–98.0)
MONO#: 0.3 10*3/uL (ref 0.1–0.9)
MONO%: 2.9 % (ref 0.0–14.0)
NEUT#: 9.2 10*3/uL — ABNORMAL HIGH (ref 1.5–6.5)
Platelets: 205 10*3/uL (ref 140–400)

## 2011-09-05 NOTE — Telephone Encounter (Signed)
Message copied by Sabino Snipes on Wed Sep 05, 2011  2:49 PM ------      Message from: Levert Feinstein      Created: Wed Sep 05, 2011  1:13 PM       Call pt H/H OK  No phlebotomy this month

## 2011-09-05 NOTE — Telephone Encounter (Signed)
Pt notified of lab results per Dr. Cyndie Chime & no need for phlebotomy this month.

## 2011-10-03 ENCOUNTER — Other Ambulatory Visit: Payer: Self-pay

## 2011-10-03 ENCOUNTER — Other Ambulatory Visit (HOSPITAL_BASED_OUTPATIENT_CLINIC_OR_DEPARTMENT_OTHER): Payer: BC Managed Care – PPO | Admitting: Lab

## 2011-10-03 DIAGNOSIS — D45 Polycythemia vera: Secondary | ICD-10-CM

## 2011-10-03 LAB — CBC WITH DIFFERENTIAL/PLATELET
Basophils Absolute: 0.1 10*3/uL (ref 0.0–0.1)
EOS%: 1.2 % (ref 0.0–7.0)
Eosinophils Absolute: 0.2 10*3/uL (ref 0.0–0.5)
HGB: 14.2 g/dL (ref 13.0–17.1)
MCH: 34.6 pg — ABNORMAL HIGH (ref 27.2–33.4)
MONO%: 2.7 % (ref 0.0–14.0)
NEUT#: 9.9 10*3/uL — ABNORMAL HIGH (ref 1.5–6.5)
RBC: 4.1 10*6/uL — ABNORMAL LOW (ref 4.20–5.82)
RDW: 18.4 % — ABNORMAL HIGH (ref 11.0–14.6)
lymph#: 1.8 10*3/uL (ref 0.9–3.3)
nRBC: 0 % (ref 0–0)

## 2011-10-03 LAB — MORPHOLOGY

## 2011-10-10 ENCOUNTER — Telehealth: Payer: Self-pay | Admitting: *Deleted

## 2011-10-10 NOTE — Telephone Encounter (Signed)
Message left on pt's vm regarding H/H & no need for phleb per Dr. Cyndie Chime.

## 2011-10-10 NOTE — Telephone Encounter (Signed)
Message copied by Sabino Snipes on Wed Oct 10, 2011  1:11 PM ------      Message from: Levert Feinstein      Created: Wed Oct 03, 2011  1:21 PM       Call H/H OK  No phlebotomy this time

## 2011-10-26 ENCOUNTER — Telehealth: Payer: Self-pay

## 2011-10-26 ENCOUNTER — Ambulatory Visit (HOSPITAL_BASED_OUTPATIENT_CLINIC_OR_DEPARTMENT_OTHER): Payer: BC Managed Care – PPO | Admitting: Lab

## 2011-10-26 DIAGNOSIS — D45 Polycythemia vera: Secondary | ICD-10-CM

## 2011-10-26 LAB — MORPHOLOGY: PLT EST: ADEQUATE

## 2011-10-26 LAB — CBC WITH DIFFERENTIAL/PLATELET
Basophils Absolute: 0.1 10*3/uL (ref 0.0–0.1)
Eosinophils Absolute: 0.1 10*3/uL (ref 0.0–0.5)
HCT: 38.7 % (ref 38.4–49.9)
LYMPH%: 17.2 % (ref 14.0–49.0)
MCV: 103.8 fL — ABNORMAL HIGH (ref 79.3–98.0)
MONO#: 0.4 10*3/uL (ref 0.1–0.9)
MONO%: 3.2 % (ref 0.0–14.0)
NEUT#: 9.4 10*3/uL — ABNORMAL HIGH (ref 1.5–6.5)
NEUT%: 77.7 % — ABNORMAL HIGH (ref 39.0–75.0)
Platelets: 226 10*3/uL (ref 140–400)
RBC: 3.73 10*6/uL — ABNORMAL LOW (ref 4.20–5.82)
WBC: 12.1 10*3/uL — ABNORMAL HIGH (ref 4.0–10.3)
nRBC: 0 % (ref 0–0)

## 2011-10-26 NOTE — Telephone Encounter (Signed)
Pt notified of lab results per Dr Patsy Lager note.  Pt verbalizes appreciation. dph

## 2011-10-26 NOTE — Telephone Encounter (Signed)
Message copied by Albertha Ghee on Fri Oct 26, 2011  4:33 PM ------      Message from: Levert Feinstein      Created: Fri Oct 26, 2011  2:57 PM       Call pt count good  No phlebotomy this time  Same dose Hydrea

## 2011-10-31 ENCOUNTER — Other Ambulatory Visit: Payer: BC Managed Care – PPO

## 2011-11-04 ENCOUNTER — Other Ambulatory Visit: Payer: Self-pay | Admitting: Internal Medicine

## 2011-11-13 ENCOUNTER — Other Ambulatory Visit: Payer: Self-pay | Admitting: Oncology

## 2011-11-13 DIAGNOSIS — D45 Polycythemia vera: Secondary | ICD-10-CM

## 2011-11-13 NOTE — Telephone Encounter (Signed)
Left VM asking patient to call and confirm his actual home dosing of his medication. Office records show 500 mg daily, but pharmacy refill request is indicating he takes 1000 mg daily.

## 2011-11-15 ENCOUNTER — Other Ambulatory Visit: Payer: Self-pay | Admitting: *Deleted

## 2011-11-15 NOTE — Telephone Encounter (Signed)
Verified with pt that he is taking 1000mg  hydrea daily.  Script in [facility] system also shows same dose.  Script sent electronically for refill.

## 2011-12-05 ENCOUNTER — Other Ambulatory Visit (HOSPITAL_BASED_OUTPATIENT_CLINIC_OR_DEPARTMENT_OTHER): Payer: BC Managed Care – PPO | Admitting: Lab

## 2011-12-05 DIAGNOSIS — D45 Polycythemia vera: Secondary | ICD-10-CM

## 2011-12-05 LAB — MORPHOLOGY

## 2011-12-05 LAB — CBC WITH DIFFERENTIAL/PLATELET
BASO%: 0.9 % (ref 0.0–2.0)
LYMPH%: 14 % (ref 14.0–49.0)
MCHC: 33.5 g/dL (ref 32.0–36.0)
MONO#: 0.4 10*3/uL (ref 0.1–0.9)
RBC: 3.29 10*6/uL — ABNORMAL LOW (ref 4.20–5.82)
WBC: 10.3 10*3/uL (ref 4.0–10.3)
lymph#: 1.5 10*3/uL (ref 0.9–3.3)
nRBC: 0 % (ref 0–0)

## 2012-01-02 ENCOUNTER — Other Ambulatory Visit (HOSPITAL_BASED_OUTPATIENT_CLINIC_OR_DEPARTMENT_OTHER): Payer: BC Managed Care – PPO

## 2012-01-02 ENCOUNTER — Telehealth: Payer: Self-pay | Admitting: *Deleted

## 2012-01-02 DIAGNOSIS — D45 Polycythemia vera: Secondary | ICD-10-CM

## 2012-01-02 LAB — CBC WITH DIFFERENTIAL/PLATELET
Basophils Absolute: 0.1 10*3/uL (ref 0.0–0.1)
EOS%: 1.4 % (ref 0.0–7.0)
Eosinophils Absolute: 0.2 10*3/uL (ref 0.0–0.5)
HCT: 37.3 % — ABNORMAL LOW (ref 38.4–49.9)
HGB: 12.5 g/dL — ABNORMAL LOW (ref 13.0–17.1)
MCH: 35.6 pg — ABNORMAL HIGH (ref 27.2–33.4)
MONO#: 0.3 10*3/uL (ref 0.1–0.9)
NEUT#: 10 10*3/uL — ABNORMAL HIGH (ref 1.5–6.5)
NEUT%: 82.5 % — ABNORMAL HIGH (ref 39.0–75.0)
lymph#: 1.6 10*3/uL (ref 0.9–3.3)

## 2012-01-02 NOTE — Telephone Encounter (Signed)
Message copied by Orbie Hurst on Wed Jan 02, 2012 11:20 AM ------      Message from: Levert Feinstein      Created: Wed Jan 02, 2012 10:46 AM       Call pt - stay on same dose of Hydrea  No need for phlebotomy

## 2012-01-02 NOTE — Telephone Encounter (Signed)
Called patient and let him know to stay on the same dose of hydrea.  No need for phlebotomy.  Pt. States he would like to come less often - say once a year.  Let him know that he sees Dr. Cyndie Chime in August and that would be a good time to review schedule and see if he can come less often.

## 2012-01-21 ENCOUNTER — Telehealth: Payer: Self-pay | Admitting: Oncology

## 2012-01-21 NOTE — Telephone Encounter (Signed)
Called pt and left message regarding appt date r/s to 9/10 per MD's call day

## 2012-02-05 ENCOUNTER — Other Ambulatory Visit: Payer: BC Managed Care – PPO | Admitting: Lab

## 2012-02-05 ENCOUNTER — Ambulatory Visit: Payer: BC Managed Care – PPO | Admitting: Oncology

## 2012-02-07 ENCOUNTER — Other Ambulatory Visit: Payer: Self-pay | Admitting: Oncology

## 2012-02-07 DIAGNOSIS — D45 Polycythemia vera: Secondary | ICD-10-CM

## 2012-02-12 ENCOUNTER — Emergency Department (HOSPITAL_COMMUNITY)
Admission: EM | Admit: 2012-02-12 | Discharge: 2012-02-12 | Disposition: A | Discharge status: Home / Self Care | Payer: Self-pay | Source: Home / Self Care

## 2012-02-19 ENCOUNTER — Encounter: Payer: Self-pay | Admitting: Oncology

## 2012-02-19 ENCOUNTER — Telehealth: Payer: Self-pay | Admitting: Hematology and Oncology

## 2012-02-19 ENCOUNTER — Telehealth: Payer: Self-pay | Admitting: Oncology

## 2012-02-19 ENCOUNTER — Ambulatory Visit (HOSPITAL_BASED_OUTPATIENT_CLINIC_OR_DEPARTMENT_OTHER): Payer: BC Managed Care – PPO | Admitting: Oncology

## 2012-02-19 ENCOUNTER — Other Ambulatory Visit (HOSPITAL_BASED_OUTPATIENT_CLINIC_OR_DEPARTMENT_OTHER): Payer: BC Managed Care – PPO

## 2012-02-19 VITALS — BP 135/74 | HR 73 | Temp 97.0°F | Resp 20 | Wt 211.1 lb

## 2012-02-19 DIAGNOSIS — M109 Gout, unspecified: Secondary | ICD-10-CM

## 2012-02-19 DIAGNOSIS — D45 Polycythemia vera: Secondary | ICD-10-CM

## 2012-02-19 DIAGNOSIS — K279 Peptic ulcer, site unspecified, unspecified as acute or chronic, without hemorrhage or perforation: Secondary | ICD-10-CM

## 2012-02-19 HISTORY — DX: Peptic ulcer, site unspecified, unspecified as acute or chronic, without hemorrhage or perforation: K27.9

## 2012-02-19 HISTORY — DX: Gout, unspecified: M10.9

## 2012-02-19 LAB — CBC WITH DIFFERENTIAL/PLATELET
BASO%: 0.7 % (ref 0.0–2.0)
EOS%: 0.9 % (ref 0.0–7.0)
MCH: 34 pg — ABNORMAL HIGH (ref 27.2–33.4)
MCHC: 32.6 g/dL (ref 32.0–36.0)
MCV: 104.2 fL — ABNORMAL HIGH (ref 79.3–98.0)
MONO%: 3 % (ref 0.0–14.0)
RBC: 4.03 10*6/uL — ABNORMAL LOW (ref 4.20–5.82)
RDW: 17.6 % — ABNORMAL HIGH (ref 11.0–14.6)
lymph#: 2.4 10*3/uL (ref 0.9–3.3)

## 2012-02-19 LAB — MORPHOLOGY: PLT EST: ADEQUATE

## 2012-02-19 NOTE — Telephone Encounter (Signed)
Sent patient to lab today, but lab was already closed pt will come back tomorrow 02/20/12

## 2012-02-19 NOTE — Telephone Encounter (Signed)
Gave pt appt for lab every 2 months and MD visit in March 2014 with labs

## 2012-02-20 NOTE — Progress Notes (Signed)
Hematology and Oncology Follow Up Visit  Mark Branch [id number] 1941/02/28 71 y.o. 02/20/2012 9:34 AM   Principle Diagnosis: Encounter Diagnoses  Name Primary?  . Polycythemia vera Yes  . PUD (peptic ulcer disease)   . Gout      Interim History:   Followup visit for this 71 year old man with long-standing polycythemia vera. He was initially diagnosed in December of 1991. He was on a phlebotomy program on a regular basis until April of 1998 when he had an upper GI bleed do to peptic ulcer disease. He has required minimal phlebotomy since that time. He was started on Hydrea in 2002 when platelet count began to elevate to an unacceptable level. Current dose 500 mg daily.  He reports no medical problems since last visit. He denies any chest pain, palpitations, abdominal pain, early satiety, no neurologic symptoms specifically no headache, change in vision, focal weakness. He continues to work full-time at mother [employer].   Medications: reviewed  Allergies:  Allergies  Allergen Reactions  . Sulfonamide Derivatives     Review of Systems: Constitutional:   No constitutional symptoms Respiratory: No cough or dyspnea Cardiovascular:  See above Gastrointestinal: No change in bowel habit Genito-Urinary: No urinary tract symptoms Musculoskeletal: No muscle or bone pain Neurologic: See above Skin: No rash or ecchymosis Remaining ROS negative.  Physical Exam: Blood pressure 135/74, pulse 73, temperature 97 F (36.1 C), temperature source Oral, resp. rate 20, weight 211 lb 1.6 oz (95.754 kg). Wt Readings from Last 3 Encounters:  02/19/12 211 lb 1.6 oz (95.754 kg)  08/06/11 207 lb 8 oz (94.121 kg)  07/24/11 218 lb (98.884 kg)     General appearance: Well-nourished Caucasian man. ruddy complexion. HENNT: Pharynx no erythema or exudate Lymph nodes: No lymphadenopathy Breasts: Lungs: Clear to auscultation resonant to percussion Heart: Regular rhythm no  murmur Abdomen: Soft nontender. Spleen enlarged down to umbilicus proximally 8 cm Extremities: No edema no calf tenderness Vascular: No cyanosis Neurologic: Mental status intact, PERRLA, optic discs not well visualized, motor strength 5 over 5, reflexes 1+ symmetric, coordination and gait normal. Skin: No rash or ecchymosis  Lab Results: Lab Results  Component Value Date   WBC 13.8* 02/19/2012   HGB 13.7 02/19/2012   HCT 42.0 02/19/2012   MCV 104.2* 02/19/2012   PLT 255 02/19/2012     Chemistry      Component Value Date/Time   NA 142 05/26/2010 1432   K 4.9 05/26/2010 1432   CL 105 05/26/2010 1432   CO2 26 05/26/2010 1432   BUN 20 05/26/2010 1432   CREATININE 1.10 05/26/2010 1432      Component Value Date/Time   CALCIUM 9.2 05/26/2010 1432   ALKPHOS 61 05/26/2010 1432   AST 26 05/26/2010 1432   ALT 17 05/26/2010 1432   BILITOT 2.3* 05/26/2010 1432       Radiological Studies: No results found.  Impression and Plan: #1. Long-standing polycythemia vera. He remained stable with minimal phlebotomy requirements on low-dose Hydrea 500 mg daily. Plan continue the same. Continue low-dose aspirin.  #2. Secondary thrombocytosis now controlled with Hydrea  #3. History of peptic ulcer disease with previous GI bleed April 1998  after a weekend of partying and drinking alcohol. No recurrent events for many years.  #4. Benign prostatic hyperplasia on Flomax  #5. Gout currently asymptomatic.   CC:. Dr. Lucianne Lei   Levert Feinstein, MD 9/11/20139:34 AM

## 2012-03-05 ENCOUNTER — Other Ambulatory Visit: Payer: BC Managed Care – PPO

## 2012-03-08 ENCOUNTER — Other Ambulatory Visit: Payer: Self-pay | Admitting: Internal Medicine

## 2012-04-04 ENCOUNTER — Other Ambulatory Visit: Payer: Self-pay | Admitting: Oncology

## 2012-04-21 ENCOUNTER — Other Ambulatory Visit (HOSPITAL_BASED_OUTPATIENT_CLINIC_OR_DEPARTMENT_OTHER): Payer: BC Managed Care – PPO | Admitting: Lab

## 2012-04-21 DIAGNOSIS — D45 Polycythemia vera: Secondary | ICD-10-CM

## 2012-04-21 LAB — CBC WITH DIFFERENTIAL/PLATELET
BASO%: 0.7 % (ref 0.0–2.0)
LYMPH%: 15.2 % (ref 14.0–49.0)
MCHC: 31.7 g/dL — ABNORMAL LOW (ref 32.0–36.0)
MCV: 102.1 fL — ABNORMAL HIGH (ref 79.3–98.0)
MONO%: 2.9 % (ref 0.0–14.0)
Platelets: 207 10*3/uL (ref 140–400)
RBC: 3.89 10*6/uL — ABNORMAL LOW (ref 4.20–5.82)
RDW: 19.1 % — ABNORMAL HIGH (ref 11.0–14.6)
WBC: 10 10*3/uL (ref 4.0–10.3)
nRBC: 0 % (ref 0–0)

## 2012-04-21 LAB — MORPHOLOGY: PLT EST: ADEQUATE

## 2012-06-05 ENCOUNTER — Other Ambulatory Visit: Payer: Self-pay | Admitting: Internal Medicine

## 2012-06-13 ENCOUNTER — Other Ambulatory Visit: Payer: Self-pay | Admitting: Dermatology

## 2012-06-23 ENCOUNTER — Other Ambulatory Visit (HOSPITAL_BASED_OUTPATIENT_CLINIC_OR_DEPARTMENT_OTHER): Payer: BC Managed Care – PPO | Admitting: Lab

## 2012-06-23 DIAGNOSIS — D45 Polycythemia vera: Secondary | ICD-10-CM

## 2012-06-23 LAB — CBC WITH DIFFERENTIAL/PLATELET
Basophils Absolute: 0.1 10*3/uL (ref 0.0–0.1)
EOS%: 0.7 % (ref 0.0–7.0)
Eosinophils Absolute: 0.1 10*3/uL (ref 0.0–0.5)
HGB: 12.7 g/dL — ABNORMAL LOW (ref 13.0–17.1)
MONO#: 0.5 10*3/uL (ref 0.1–0.9)
NEUT#: 9.5 10*3/uL — ABNORMAL HIGH (ref 1.5–6.5)
RDW: 19.3 % — ABNORMAL HIGH (ref 11.0–14.6)
WBC: 11.7 10*3/uL — ABNORMAL HIGH (ref 4.0–10.3)
lymph#: 1.5 10*3/uL (ref 0.9–3.3)

## 2012-06-23 LAB — MORPHOLOGY: PLT EST: ADEQUATE

## 2012-07-16 ENCOUNTER — Other Ambulatory Visit: Payer: Self-pay | Admitting: Internal Medicine

## 2012-07-21 ENCOUNTER — Telehealth: Payer: Self-pay | Admitting: *Deleted

## 2012-07-21 NOTE — Telephone Encounter (Signed)
Message copied by Gala Romney on Mon Jul 21, 2012  3:19 PM ------      Message from: Levert Feinstein      Created: Mon Jun 23, 2012  2:11 PM       Call pt - counts OK; same dose Hydrea; no need for phlebotomy ------

## 2012-07-21 NOTE — Telephone Encounter (Signed)
Spoke with pt regarding lab results from January; apologized to him--not sure if he was notified.  Explained that per Dr. Cyndie Chime, counts OK and stay on same dose of Hydrea; no need for phlebotomy.  Pt verbalized understanding and stated he did not get any previous message regarding this but OK.

## 2012-08-18 ENCOUNTER — Ambulatory Visit (HOSPITAL_BASED_OUTPATIENT_CLINIC_OR_DEPARTMENT_OTHER): Payer: BC Managed Care – PPO | Admitting: Oncology

## 2012-08-18 ENCOUNTER — Other Ambulatory Visit (HOSPITAL_BASED_OUTPATIENT_CLINIC_OR_DEPARTMENT_OTHER): Payer: BC Managed Care – PPO

## 2012-08-18 VITALS — BP 147/72 | HR 88 | Temp 97.4°F | Resp 20 | Ht 73.0 in | Wt 216.6 lb

## 2012-08-18 DIAGNOSIS — R161 Splenomegaly, not elsewhere classified: Secondary | ICD-10-CM | POA: Insufficient documentation

## 2012-08-18 LAB — CBC WITH DIFFERENTIAL/PLATELET
BASO%: 0.7 % (ref 0.0–2.0)
Basophils Absolute: 0.1 10e3/uL (ref 0.0–0.1)
EOS%: 0.9 % (ref 0.0–7.0)
Eosinophils Absolute: 0.1 10e3/uL (ref 0.0–0.5)
HCT: 40 % (ref 38.4–49.9)
HGB: 13.2 g/dL (ref 13.0–17.1)
LYMPH%: 15.9 % (ref 14.0–49.0)
MCH: 32.8 pg (ref 27.2–33.4)
MCHC: 33.1 g/dL (ref 32.0–36.0)
MCV: 99.2 fL — ABNORMAL HIGH (ref 79.3–98.0)
MONO#: 0.3 10e3/uL (ref 0.1–0.9)
MONO%: 2.5 % (ref 0.0–14.0)
NEUT#: 10.3 10e3/uL — ABNORMAL HIGH (ref 1.5–6.5)
NEUT%: 80 % — ABNORMAL HIGH (ref 39.0–75.0)
Platelets: 233 10e3/uL (ref 140–400)
RBC: 4.03 10e6/uL — ABNORMAL LOW (ref 4.20–5.82)
RDW: 18 % — ABNORMAL HIGH (ref 11.0–14.6)
WBC: 12.9 10e3/uL — ABNORMAL HIGH (ref 4.0–10.3)
lymph#: 2 10e3/uL (ref 0.9–3.3)

## 2012-08-18 LAB — LACTATE DEHYDROGENASE (CC13): LDH: 329 U/L — ABNORMAL HIGH (ref 125–245)

## 2012-08-18 LAB — COMPREHENSIVE METABOLIC PANEL (CC13)
ALT: 18 U/L (ref 0–55)
AST: 22 U/L (ref 5–34)
Albumin: 4.4 g/dL (ref 3.5–5.0)
CO2: 25 mEq/L (ref 22–29)
Calcium: 9.5 mg/dL (ref 8.4–10.4)
Chloride: 111 mEq/L — ABNORMAL HIGH (ref 98–107)
Potassium: 4.6 mEq/L (ref 3.5–5.1)
Sodium: 146 mEq/L — ABNORMAL HIGH (ref 136–145)
Total Protein: 7 g/dL (ref 6.4–8.3)

## 2012-08-18 NOTE — Progress Notes (Signed)
Hematology and Oncology Follow Up Visit  Mark Branch [id number] 06/29/1940 73 y.o. 08/18/2012 3:36 PM   Principle Diagnosis: Encounter Diagnosis  Name Primary?  [name] POLYCYTHEMIA RUBRA VERA Yes     Interim History:   Followup visit for this 72 year old man with long-standing polycythemia vera. He was initially diagnosed in December of 1991. He was on a phlebotomy program on a regular basis until April of 1998 when he had an upper GI bleed do to peptic ulcer disease. He has required minimal phlebotomy since that time. He was started on Hydrea in 2002 when platelet count began to elevate to an unacceptable level. Current dose 500 mg daily.  He has had no interim medical problems that he will admit to. His wife who is in remission from metastatic lung cancer unfortunately is suffering delayed effects of previous surgery and cranial radiation and has now developed a dementing illness and is living in a assisted living facility.   Medications: reviewed  Allergies:  Allergies  Allergen Reactions  . Sulfonamide Derivatives     Review of Systems: Constitutional:  No constitutional symptoms  Respiratory: No cough or dyspnea Cardiovascular:  No chest pain or palpitations Gastrointestinal: No abdominal pain. Genito-Urinary: Nocturia Musculoskeletal: No muscle or bone pain Neurologic: No headache or change in vision Skin: No rash Remaining ROS negative.  Physical Exam: Blood pressure 147/72, pulse 88, temperature 97.4 F (36.3 C), temperature source Oral, resp. rate 20, height 6\' 1"  (1.854 m), weight 216 lb 9.6 oz (98.249 kg). Wt Readings from Last 3 Encounters:  08/18/12 216 lb 9.6 oz (98.249 kg)  02/19/12 211 lb 1.6 oz (95.754 kg)  08/06/11 207 lb 8 oz (94.121 kg)     General appearance: Well-nourished Caucasian man HENNT: Pharynx no erythema or exudate Lymph nodes: No adenopathy Breasts:  Lungs: Clear to auscultation resonant to percussion Heart: Regular rhythm no  murmur Abdomen: Soft, nontender, massively enlarged spleen down to the umbilicus no change from prior exam Extremities: No edema, no calf tenderness Vascular: No cyanosis Neurologic: PERRLA, I was not able to visualize his optic discs, motor strength 5 over 5, reflexes 1+ symmetric Skin: Ruddy complexion  Lab Results: Lab Results  Component Value Date   WBC 12.9* 08/18/2012   HGB 13.2 08/18/2012   HCT 40.0 08/18/2012   MCV 99.2* 08/18/2012   PLT 233 08/18/2012     Chemistry      Component Value Date/Time   NA 146* 08/18/2012 1357   NA 142 05/26/2010 1432   K 4.6 08/18/2012 1357   K 4.9 05/26/2010 1432   CL 111* 08/18/2012 1357   CL 105 05/26/2010 1432   CO2 25 08/18/2012 1357   CO2 26 05/26/2010 1432   BUN 20.2 08/18/2012 1357   BUN 20 05/26/2010 1432   CREATININE 1.0 08/18/2012 1357   CREATININE 1.10 05/26/2010 1432      Component Value Date/Time   CALCIUM 9.5 08/18/2012 1357   CALCIUM 9.2 05/26/2010 1432   ALKPHOS 65 08/18/2012 1357   ALKPHOS 61 05/26/2010 1432   AST 22 08/18/2012 1357   AST 26 05/26/2010 1432   ALT 18 08/18/2012 1357   ALT 17 05/26/2010 1432   BILITOT 2.79* 08/18/2012 1357   BILITOT 2.3* 05/26/2010 1432      Impression and Plan: #1. Long-standing polycythemia vera.  He remains stable with minimal phlebotomy requirements on low-dose Hydrea 500 mg daily and low-dose aspirin. Plan:  continue the same.   #2. Secondary thrombocytosis now controlled with Hydrea   #  3. History of peptic ulcer disease with previous GI bleed April 1998 after a weekend of partying and drinking alcohol. No recurrent events for many years.   #4. Benign prostatic hyperplasia on Flomax   #5. Gout currently asymptomatic.    CC:. Dr. Lucianne Lei      Levert Feinstein, MD 3/10/20143:36 PM

## 2012-08-21 ENCOUNTER — Telehealth: Payer: Self-pay | Admitting: Oncology

## 2012-08-21 NOTE — Telephone Encounter (Signed)
Gave pt appt for lab abd MD on September 2014

## 2012-09-03 ENCOUNTER — Other Ambulatory Visit: Payer: Self-pay | Admitting: *Deleted

## 2012-09-03 DIAGNOSIS — D45 Polycythemia vera: Secondary | ICD-10-CM

## 2012-09-03 MED ORDER — HYDROXYUREA 500 MG PO CAPS: 1000.0000 mg | ORAL_CAPSULE | Freq: Every day | ORAL | Status: DC

## 2012-09-04 ENCOUNTER — Other Ambulatory Visit: Payer: Self-pay | Admitting: *Deleted

## 2012-09-04 DIAGNOSIS — D45 Polycythemia vera: Secondary | ICD-10-CM

## 2012-09-04 MED ORDER — HYDROXYUREA 500 MG PO CAPS: 1000.0000 mg | ORAL_CAPSULE | Freq: Every day | ORAL | Status: DC

## 2012-09-16 ENCOUNTER — Encounter: Payer: Self-pay | Admitting: Internal Medicine

## 2012-09-16 ENCOUNTER — Ambulatory Visit (INDEPENDENT_AMBULATORY_CARE_PROVIDER_SITE_OTHER): Payer: BC Managed Care – PPO | Admitting: Internal Medicine

## 2012-09-16 VITALS — BP 132/80 | HR 76 | Temp 97.8°F | Resp 16 | Ht 73.0 in | Wt 220.0 lb

## 2012-09-16 DIAGNOSIS — K219 Gastro-esophageal reflux disease without esophagitis: Secondary | ICD-10-CM

## 2012-09-16 DIAGNOSIS — Z0000 Encounter for general adult medical examination without abnormal findings: Secondary | ICD-10-CM

## 2012-09-16 DIAGNOSIS — Z86010 Personal history of colonic polyps: Secondary | ICD-10-CM

## 2012-09-16 DIAGNOSIS — D45 Polycythemia vera: Secondary | ICD-10-CM

## 2012-09-16 DIAGNOSIS — Z23 Encounter for immunization: Secondary | ICD-10-CM

## 2012-09-16 NOTE — Progress Notes (Signed)
  Subjective:    Patient ID: Mark Branch, male    DOB: 08/05/1940, 72 y.o.   MRN: [id number]  HPI  The patient is here for a wellness exam. The patient has been doing well overall without major physical or psychological issues going on lately.  Wt Readings from Last 3 Encounters:  09/16/12 220 lb (99.791 kg)  08/18/12 216 lb 9.6 oz (98.249 kg)  02/19/12 211 lb 1.6 oz (95.754 kg)   BP Readings from Last 3 Encounters:  09/16/12 132/80  08/18/12 147/72  02/19/12 135/74      Review of Systems  Constitutional: Negative for appetite change, fatigue and unexpected weight change.  HENT: Negative for nosebleeds, congestion, sore throat, sneezing, trouble swallowing, neck pain, neck stiffness and dental problem.   Eyes: Negative for itching and visual disturbance.  Respiratory: Negative for cough.   Cardiovascular: Negative for chest pain, palpitations and leg swelling.  Gastrointestinal: Negative for nausea, vomiting, diarrhea, constipation, blood in stool and abdominal distention.  Genitourinary: Negative for urgency, frequency and hematuria.  Musculoskeletal: Negative for back pain, joint swelling and gait problem.  Skin: Negative for rash.  Allergic/Immunologic: Negative for food allergies.  Neurological: Negative for dizziness, tremors, speech difficulty and weakness.  Psychiatric/Behavioral: Negative for suicidal ideas, sleep disturbance, dysphoric mood and agitation. The patient is not nervous/anxious.        Objective:   Physical Exam  Constitutional: He is oriented to person, place, and time. He appears well-developed. No distress.  Hyperemic face  HENT:  Mouth/Throat: Oropharynx is clear and moist. No oropharyngeal exudate.  Eyes: Conjunctivae are normal. Pupils are equal, round, and reactive to light.  Neck: Normal range of motion. No JVD present. No thyromegaly present.  Cardiovascular: Normal rate, regular rhythm, normal heart sounds and intact distal pulses.  Exam  reveals no gallop and no friction rub.   No murmur heard. Pulmonary/Chest: Effort normal and breath sounds normal. No respiratory distress. He has no wheezes. He has no rales. He exhibits no tenderness.  Abdominal: Soft. Bowel sounds are normal. He exhibits no distension and no mass. There is no tenderness. There is no rebound and no guarding.  Genitourinary:  Rectal/prostate per Dr Isabel Caprice  Musculoskeletal: Normal range of motion. He exhibits no edema and no tenderness.  Lymphadenopathy:    He has no cervical adenopathy.  Neurological: He is alert and oriented to person, place, and time. He has normal reflexes. No cranial nerve deficit. He exhibits normal muscle tone. Coordination normal.  Skin: Skin is warm and dry. No rash noted.  Psychiatric: He has a normal mood and affect. His behavior is normal. Judgment and thought content normal.   Lab Results  Component Value Date   WBC 12.9* 08/18/2012   HGB 13.2 08/18/2012   HCT 40.0 08/18/2012   PLT 233 08/18/2012   GLUCOSE 117* 08/18/2012   CHOL 144 02/26/2008   TRIG 81 02/26/2008   HDL 23.1* 02/26/2008   LDLCALC 105* 02/26/2008   ALT 18 08/18/2012   AST 22 08/18/2012   NA 146* 08/18/2012   K 4.6 08/18/2012   CL 111* 08/18/2012   CREATININE 1.0 08/18/2012   BUN 20.2 08/18/2012   CO2 25 08/18/2012   TSH 3.19 02/26/2008   PSA 0.66 04/09/2006          Assessment & Plan:

## 2012-09-16 NOTE — Assessment & Plan Note (Signed)
Continue with current prescription therapy as reflected on the Med list.  

## 2012-09-16 NOTE — Assessment & Plan Note (Signed)
Due colon in 2017 Dr Marina Goodell

## 2012-09-16 NOTE — Patient Instructions (Signed)
Wt Readings from Last 3 Encounters:  09/16/12 220 lb (99.791 kg)  08/18/12 216 lb 9.6 oz (98.249 kg)  02/19/12 211 lb 1.6 oz (95.754 kg)

## 2012-09-23 ENCOUNTER — Other Ambulatory Visit (INDEPENDENT_AMBULATORY_CARE_PROVIDER_SITE_OTHER): Payer: BC Managed Care – PPO

## 2012-09-23 DIAGNOSIS — Z0000 Encounter for general adult medical examination without abnormal findings: Secondary | ICD-10-CM

## 2012-09-23 DIAGNOSIS — Z86010 Personal history of colonic polyps: Secondary | ICD-10-CM

## 2012-09-23 DIAGNOSIS — D45 Polycythemia vera: Secondary | ICD-10-CM

## 2012-09-23 DIAGNOSIS — K219 Gastro-esophageal reflux disease without esophagitis: Secondary | ICD-10-CM

## 2012-09-23 LAB — HEPATIC FUNCTION PANEL
ALT: 18 U/L (ref 0–53)
AST: 24 U/L (ref 0–37)
Albumin: 4.3 g/dL (ref 3.5–5.2)
Alkaline Phosphatase: 50 U/L (ref 39–117)

## 2012-09-23 LAB — URINALYSIS
Ketones, ur: NEGATIVE
Specific Gravity, Urine: >=1.03 (ref 1.000–1.030)
Total Protein, Urine: NEGATIVE
Urine Glucose: NEGATIVE
Urobilinogen, UA: 0.2 (ref 0.0–1.0)
pH: 5 (ref 5.0–8.0)

## 2012-09-23 LAB — LIPID PANEL
Total CHOL/HDL Ratio: 4
Triglycerides: 90 mg/dL (ref 0.0–149.0)

## 2012-09-23 LAB — BASIC METABOLIC PANEL
CO2: 27 mEq/L (ref 19–32)
Calcium: 8.8 mg/dL (ref 8.4–10.5)
Creatinine, Ser: 0.9 mg/dL (ref 0.4–1.5)
GFR: 88.12 mL/min (ref >60.00)

## 2012-09-23 LAB — PSA: PSA: 0.23 ng/mL (ref 0.10–4.00)

## 2012-11-13 ENCOUNTER — Telehealth: Payer: Self-pay | Admitting: Oncology

## 2012-11-13 NOTE — Telephone Encounter (Signed)
Gave pt appt for June /r/s from May and August labs every 2 months per standing order, pt also aware of September appt with MD and labs

## 2012-11-16 ENCOUNTER — Other Ambulatory Visit: Payer: Self-pay | Admitting: Internal Medicine

## 2012-11-17 ENCOUNTER — Other Ambulatory Visit (HOSPITAL_BASED_OUTPATIENT_CLINIC_OR_DEPARTMENT_OTHER): Payer: BC Managed Care – PPO | Admitting: Lab

## 2012-11-17 DIAGNOSIS — D45 Polycythemia vera: Secondary | ICD-10-CM

## 2012-11-17 LAB — MORPHOLOGY: PLT EST: ADEQUATE

## 2012-11-17 LAB — CBC WITH DIFFERENTIAL/PLATELET
BASO%: 0.9 % (ref 0.0–2.0)
Eosinophils Absolute: 0.1 10*3/uL (ref 0.0–0.5)
HCT: 39.7 % (ref 38.4–49.9)
HGB: 12.7 g/dL — ABNORMAL LOW (ref 13.0–17.1)
MCHC: 32 g/dL (ref 32.0–36.0)
MONO#: 0.3 10*3/uL (ref 0.1–0.9)
NEUT#: 8.3 10*3/uL — ABNORMAL HIGH (ref 1.5–6.5)
NEUT%: 77.6 % — ABNORMAL HIGH (ref 39.0–75.0)
WBC: 10.7 10*3/uL — ABNORMAL HIGH (ref 4.0–10.3)
lymph#: 1.8 10*3/uL (ref 0.9–3.3)

## 2012-11-19 ENCOUNTER — Telehealth: Payer: Self-pay | Admitting: *Deleted

## 2012-11-19 NOTE — Telephone Encounter (Signed)
Message copied by Sabino Snipes on Wed Nov 19, 2012  3:52 PM ------      Message from: Levert Feinstein      Created: Mon Nov 17, 2012  6:54 PM       Call counts stable; same dose Hydrea  Continue Q 2 month CBC,diff ------

## 2012-11-19 NOTE — Telephone Encounter (Signed)
Reached pt at home # & informed per Dr Cyndie Chime that counts stable & cont hydrea & q 2 mo lab.  Reminded of next MD appt.

## 2013-01-11 ENCOUNTER — Other Ambulatory Visit: Payer: Self-pay | Admitting: Internal Medicine

## 2013-01-19 ENCOUNTER — Other Ambulatory Visit (HOSPITAL_BASED_OUTPATIENT_CLINIC_OR_DEPARTMENT_OTHER): Payer: BC Managed Care – PPO | Admitting: Lab

## 2013-01-19 DIAGNOSIS — D45 Polycythemia vera: Secondary | ICD-10-CM

## 2013-01-19 LAB — CBC WITH DIFFERENTIAL/PLATELET
Basophils Absolute: 0.1 10*3/uL (ref 0.0–0.1)
Eosinophils Absolute: 0.2 10*3/uL (ref 0.0–0.5)
HGB: 13 g/dL (ref 13.0–17.1)
NEUT#: 8.9 10*3/uL — ABNORMAL HIGH (ref 1.5–6.5)
RDW: 19.2 % — ABNORMAL HIGH (ref 11.0–14.6)
lymph#: 1.5 10*3/uL (ref 0.9–3.3)

## 2013-01-19 LAB — MORPHOLOGY

## 2013-01-21 ENCOUNTER — Telehealth: Payer: Self-pay | Admitting: *Deleted

## 2013-01-21 NOTE — Telephone Encounter (Signed)
Spoke with patient.  Let him know lab is stable.  He is to continue the same dose of hydrea.  He does not need a phlebotomy.  He appreciated the phone call.

## 2013-01-21 NOTE — Telephone Encounter (Signed)
Message copied by Orbie Hurst on Wed Jan 21, 2013  3:53 PM ------      Message from: Levert Feinstein      Created: Wed Jan 21, 2013  9:17 AM       Call pt: lab stable. Same dose Hydrea.  No need for phlebotomy ------

## 2013-01-23 ENCOUNTER — Other Ambulatory Visit: Payer: Self-pay | Admitting: *Deleted

## 2013-01-23 DIAGNOSIS — D45 Polycythemia vera: Secondary | ICD-10-CM

## 2013-01-23 MED ORDER — HYDROXYUREA 500 MG PO CAPS: 1000.0000 mg | ORAL_CAPSULE | Freq: Every day | ORAL | Status: DC

## 2013-02-08 ENCOUNTER — Other Ambulatory Visit: Payer: Self-pay | Admitting: Internal Medicine

## 2013-02-16 ENCOUNTER — Ambulatory Visit (HOSPITAL_BASED_OUTPATIENT_CLINIC_OR_DEPARTMENT_OTHER): Payer: BC Managed Care – PPO | Admitting: Oncology

## 2013-02-16 ENCOUNTER — Other Ambulatory Visit (HOSPITAL_BASED_OUTPATIENT_CLINIC_OR_DEPARTMENT_OTHER): Payer: BC Managed Care – PPO | Admitting: Lab

## 2013-02-16 VITALS — BP 129/71 | HR 63 | Temp 96.8°F | Resp 18 | Ht 73.0 in | Wt 215.0 lb

## 2013-02-16 DIAGNOSIS — D45 Polycythemia vera: Secondary | ICD-10-CM

## 2013-02-16 DIAGNOSIS — R161 Splenomegaly, not elsewhere classified: Secondary | ICD-10-CM

## 2013-02-16 LAB — COMPREHENSIVE METABOLIC PANEL (CC13)
ALT: 14 U/L (ref 0–55)
Albumin: 4.2 g/dL (ref 3.5–5.0)
Alkaline Phosphatase: 59 U/L (ref 40–150)
CO2: 27 mEq/L (ref 22–29)
Glucose: 112 mg/dl (ref 70–140)
Potassium: 4.4 mEq/L (ref 3.5–5.1)
Sodium: 143 mEq/L (ref 136–145)
Total Protein: 6.6 g/dL (ref 6.4–8.3)

## 2013-02-16 LAB — LACTATE DEHYDROGENASE (CC13): LDH: 291 U/L — ABNORMAL HIGH (ref 125–245)

## 2013-02-16 NOTE — Progress Notes (Signed)
Hematology and Oncology Follow Up Visit  Mark Branch [id number] July 16, 1940 72 y.o. 02/16/2013 12:58 PM   Principle Diagnosis: Encounter Diagnoses  Name Primary?  [name] POLYCYTHEMIA RUBRA VERA Yes  . Splenomegaly      Interim History:    Followup visit for this 72 year old man with long-standing polycythemia vera. He was initially diagnosed in December of 1991. He was on a phlebotomy program on a regular basis until April of 1998 when he had an upper GI bleed due to peptic ulcer disease. He  required minimal phlebotomy subsequent to the GI bleed. He was started on Hydrea in 2002 when platelet count began to elevate to an unacceptable level. Current dose 1,000 mg daily. He has become phlebotomy independent.  He has had no interim medical problems. He had a routine colonoscopy 07/24/2011. 2 small polyps removed from the ascending colon. Diverticulosis noted. Despite splenomegaly, he denies early satiety.  His wife who has a history of metastatic lung cancer is now apparently  cured of her cancer but  Is severely handicapped due to complications of prior treatment, wheelchair-bound, and homebound. She is back living with Mark Branch and requires total care.   Medications: reviewed  Allergies:  Allergies  Allergen Reactions  . Sulfonamide Derivatives     Review of Systems: Constitutional:   No constitutional symptoms HEENT no sore throat Respiratory: No cough or dyspnea Cardiovascular:  No chest pain or palpitations Gastrointestinal: No abdominal pain or change in bowel habit Genito-Urinary: Minimal symptoms of BPH Musculoskeletal: No muscle bone or joint pain Neurologic: No headache or change in vision. No focal weakness. Skin: No rash or ecchymosis Remaining ROS negative.    Physical Exam: Blood pressure 129/71, pulse 63, temperature 96.8 F (36 C), temperature source Oral, resp. rate 18, height 6\' 1"  (1.854 m), weight 215 lb (97.523 kg). Wt Readings from Last 3  Encounters:  02/16/13 215 lb (97.523 kg)  09/16/12 220 lb (99.791 kg)  08/18/12 216 lb 9.6 oz (98.249 kg)     General appearance: Well-nourished Caucasian man HENNT: Pharynx no erythema or exudate Lymph nodes: No adenopathy Breasts: Lungs: Clear to auscultation resonant to percussion Heart: Regular rhythm no murmur Abdomen: Soft, nontender, spleen massively enlarged down to the umbilicus 15 cm below left costal margin, liver enlarged 4 cm Extremities: No edema, no calf tenderness Musculoskeletal: No joint deformities GU: Vascular: No cyanosis Neurologic: Mental status intact, PERRLA, I could not get a good look at the optic discs today, motor strength 5 over 5, reflexes 1+ symmetric Skin: Chronic erythema malar area of his face  Lab Results: Lab Results  Component Value Date   WBC 11.1* 01/19/2013   HGB 13.0 01/19/2013   HCT 39.8 01/19/2013   MCV 99.7* 01/19/2013   PLT 172 01/19/2013     Chemistry      Component Value Date/Time   NA 143 02/16/2013 1108   NA 138 09/23/2012 1152   K 4.4 02/16/2013 1108   K 4.5 09/23/2012 1152   CL 105 09/23/2012 1152   CL 111* 08/18/2012 1357   CO2 27 02/16/2013 1108   CO2 27 09/23/2012 1152   BUN 18.3 02/16/2013 1108   BUN 19 09/23/2012 1152   CREATININE 0.9 02/16/2013 1108   CREATININE 0.9 09/23/2012 1152      Component Value Date/Time   CALCIUM 8.9 02/16/2013 1108   CALCIUM 8.8 09/23/2012 1152   ALKPHOS 59 02/16/2013 1108   ALKPHOS 50 09/23/2012 1152   AST 19 02/16/2013 1108   AST  24 09/23/2012 1152   ALT 14 02/16/2013 1108   ALT 18 09/23/2012 1152   BILITOT 3.43* 02/16/2013 1108   BILITOT 2.9* 09/23/2012 1152      Impression:  #1. Long-standing polycythemia vera.  He remains stable with minimal to no phlebotomy requirements on low-dose Hydrea 1,000 mg daily and low-dose aspirin.  Plan: continue the same.   #2. Secondary thrombocytosis now controlled with Hydrea   #3. History of peptic ulcer disease with previous GI bleed April 1998 after a weekend of  partying and drinking alcohol. No recurrent events for many years   #4. Benign prostatic hyperplasia on Flomax   #5. Gout currently asymptomatic.     CC:. Dr. Lucianne Lei     Levert Feinstein, MD 9/8/201412:58 PM

## 2013-02-17 ENCOUNTER — Telehealth: Payer: Self-pay | Admitting: *Deleted

## 2013-02-17 NOTE — Telephone Encounter (Signed)
sw pt gv appt for 02/15/14 w/ labs@ 12noon and ov @ 12:30pm. Pt is aware that i will mail a letter/avs...td

## 2013-02-24 ENCOUNTER — Other Ambulatory Visit: Payer: Self-pay | Admitting: *Deleted

## 2013-02-24 DIAGNOSIS — D45 Polycythemia vera: Secondary | ICD-10-CM

## 2013-02-24 MED ORDER — HYDROXYUREA 500 MG PO CAPS: 1000.0000 mg | ORAL_CAPSULE | Freq: Every day | ORAL | Status: DC

## 2013-03-01 ENCOUNTER — Other Ambulatory Visit: Payer: Self-pay | Admitting: Internal Medicine

## 2013-03-15 ENCOUNTER — Other Ambulatory Visit: Payer: Self-pay | Admitting: Internal Medicine

## 2013-03-31 ENCOUNTER — Other Ambulatory Visit: Payer: Self-pay | Admitting: *Deleted

## 2013-03-31 ENCOUNTER — Other Ambulatory Visit: Payer: Self-pay | Admitting: Internal Medicine

## 2013-03-31 DIAGNOSIS — D45 Polycythemia vera: Secondary | ICD-10-CM

## 2013-03-31 MED ORDER — HYDROXYUREA 500 MG PO CAPS: 1000.0000 mg | ORAL_CAPSULE | Freq: Every day | ORAL | Status: DC

## 2013-04-13 ENCOUNTER — Ambulatory Visit (INDEPENDENT_AMBULATORY_CARE_PROVIDER_SITE_OTHER): Payer: BC Managed Care – PPO | Admitting: Internal Medicine

## 2013-04-13 ENCOUNTER — Encounter: Payer: Self-pay | Admitting: Internal Medicine

## 2013-04-13 VITALS — BP 124/70 | HR 80 | Ht 72.0 in | Wt 214.5 lb

## 2013-04-13 DIAGNOSIS — Z86010 Personal history of colonic polyps: Secondary | ICD-10-CM

## 2013-04-13 DIAGNOSIS — K219 Gastro-esophageal reflux disease without esophagitis: Secondary | ICD-10-CM

## 2013-04-13 MED ORDER — OMEPRAZOLE 20 MG PO CPDR: 20.0000 mg | DELAYED_RELEASE_CAPSULE | Freq: Every day | ORAL | Status: DC

## 2013-04-13 NOTE — Patient Instructions (Signed)
We have sent the following medications to your pharmacy for you to pick up at your convenience:  Omeprazole  

## 2013-04-13 NOTE — Progress Notes (Signed)
HISTORY OF PRESENT ILLNESS:  Mark Branch is a 72 y.o. male with a history of polycythemia vera and BPH who is followed in this office for GERD, bleeding ulcers, and adenomatous colon polyps. He was last evaluated in the office April 2012. He continues on omeprazole for reflux. On medication no symptoms. He requests refill. His last complete colonoscopy was performed February 2013. Diminutive polyps and moderate diverticulosis. Followup in 5 years recommended. GI review of systems is otherwise negative.  REVIEW OF SYSTEMS:  All non-GI ROS negative except for arthralgias  Past Medical History  Diagnosis Date  . History of colonic polyps     adenomatous  . Polycythemia vera(238.4)   . BPH (benign prostatic hyperplasia)   . GERD (gastroesophageal reflux disease)   . Diverticulosis   . PUD (peptic ulcer disease) 02/19/2012    UGI bleed   . Gout 02/19/2012    Past Surgical History  Procedure Laterality Date  . Appendectomy    . Colonoscopy    . Polypectomy      Social History Mark Branch  reports that he quit smoking about 34 years ago. His smoking use included Cigarettes. He smoked 0.00 packs per day. He has never used smokeless tobacco. He reports that he drinks about 8.4 ounces of alcohol per week. He reports that he does not use illicit drugs.  family history includes Colon cancer in his mother; Hypertension in an other family member.  Allergies  Allergen Reactions  . Sulfonamide Derivatives        PHYSICAL EXAMINATION: Vital signs: BP 124/70  Pulse 80  Ht 6' (1.829 m)  Wt 214 lb 8 oz (97.297 kg)  BMI 29.09 kg/m2 General: Well-developed, well-nourished, no acute distress HEENT: Sclerae are anicteric, conjunctiva pink. Oral mucosa intact Lungs: Clear Heart: Regular Abdomen: soft, nontender, nondistended, no obvious ascites, no peritoneal signs, normal bowel sounds. No organomegaly. Extremities: No edema Psychiatric: alert and oriented x3.  Cooperative   ASSESSMENT:  #1. GERD. Asymptomatic on PPI #2. History of bleeding ulcer. No recurrence #3. History of adenomatous colon polyps. Last colonoscopy February 2013   PLAN:  #1. Refill omeprazole 20 mg daily #2. Reflux precautions #3. Avoid unnecessary NSAIDs #4. Surveillance colonoscopy around February 2018 #5. Return to the care of PCP, interval followup as needed

## 2013-04-24 ENCOUNTER — Other Ambulatory Visit: Payer: Self-pay | Admitting: Dermatology

## 2013-06-15 ENCOUNTER — Other Ambulatory Visit: Payer: Self-pay | Admitting: *Deleted

## 2013-06-15 DIAGNOSIS — D45 Polycythemia vera: Secondary | ICD-10-CM

## 2013-06-15 MED ORDER — HYDROXYUREA 500 MG PO CAPS: 1000.0000 mg | ORAL_CAPSULE | Freq: Every day | ORAL | Status: DC

## 2013-07-14 ENCOUNTER — Telehealth: Payer: Self-pay | Admitting: *Deleted

## 2013-07-14 NOTE — Telephone Encounter (Signed)
Received call from Leonore asking if OK to dispense 90 day supply of hydroxyurea to pt.  OK given.  She did not need a new script & can use refills on original script from 06/15/13.

## 2013-07-19 ENCOUNTER — Other Ambulatory Visit: Payer: Self-pay | Admitting: Internal Medicine

## 2013-08-10 ENCOUNTER — Encounter: Payer: Self-pay | Admitting: Oncology

## 2013-08-28 ENCOUNTER — Telehealth: Payer: Self-pay | Admitting: Oncology

## 2013-08-28 ENCOUNTER — Other Ambulatory Visit: Payer: Self-pay | Admitting: *Deleted

## 2013-08-28 DIAGNOSIS — D45 Polycythemia vera: Secondary | ICD-10-CM

## 2013-08-28 NOTE — Telephone Encounter (Signed)
Called pt left message lab Monday

## 2013-08-31 ENCOUNTER — Other Ambulatory Visit (HOSPITAL_BASED_OUTPATIENT_CLINIC_OR_DEPARTMENT_OTHER): Payer: BC Managed Care – PPO

## 2013-08-31 DIAGNOSIS — D45 Polycythemia vera: Secondary | ICD-10-CM

## 2013-08-31 DIAGNOSIS — R161 Splenomegaly, not elsewhere classified: Secondary | ICD-10-CM

## 2013-08-31 LAB — COMPREHENSIVE METABOLIC PANEL (CC13)
ALBUMIN: 4.5 g/dL (ref 3.5–5.0)
ALT: 17 U/L (ref 0–55)
AST: 22 U/L (ref 5–34)
Alkaline Phosphatase: 55 U/L (ref 40–150)
Anion Gap: 11 mEq/L (ref 3–11)
BUN: 18 mg/dL (ref 7.0–26.0)
CO2: 23 mEq/L (ref 22–29)
Calcium: 9.6 mg/dL (ref 8.4–10.4)
Chloride: 108 mEq/L (ref 98–109)
Creatinine: 0.9 mg/dL (ref 0.7–1.3)
Glucose: 100 mg/dl (ref 70–140)
POTASSIUM: 4.9 meq/L (ref 3.5–5.1)
Sodium: 142 mEq/L (ref 136–145)
Total Bilirubin: 2.95 mg/dL — ABNORMAL HIGH (ref 0.20–1.20)
Total Protein: 6.8 g/dL (ref 6.4–8.3)

## 2013-08-31 LAB — CBC WITH DIFFERENTIAL/PLATELET
BASO%: 0.9 % (ref 0.0–2.0)
Basophils Absolute: 0.1 10*3/uL (ref 0.0–0.1)
EOS%: 1 % (ref 0.0–7.0)
Eosinophils Absolute: 0.1 10*3/uL (ref 0.0–0.5)
HEMATOCRIT: 40.4 % (ref 38.4–49.9)
HGB: 13.1 g/dL (ref 13.0–17.1)
LYMPH%: 18.9 % (ref 14.0–49.0)
MCH: 33.5 pg — AB (ref 27.2–33.4)
MCHC: 32.5 g/dL (ref 32.0–36.0)
MCV: 103.2 fL — ABNORMAL HIGH (ref 79.3–98.0)
MONO#: 0.2 10*3/uL (ref 0.1–0.9)
MONO%: 1.9 % (ref 0.0–14.0)
NEUT#: 9.2 10*3/uL — ABNORMAL HIGH (ref 1.5–6.5)
NEUT%: 77.3 % — AB (ref 39.0–75.0)
Platelets: 195 10*3/uL (ref 140–400)
RBC: 3.91 10*6/uL — ABNORMAL LOW (ref 4.20–5.82)
RDW: 18.4 % — ABNORMAL HIGH (ref 11.0–14.6)
WBC: 11.9 10*3/uL — ABNORMAL HIGH (ref 4.0–10.3)
lymph#: 2.2 10*3/uL (ref 0.9–3.3)

## 2013-08-31 LAB — MORPHOLOGY: PLT EST: ADEQUATE

## 2013-08-31 LAB — LACTATE DEHYDROGENASE (CC13): LDH: 317 U/L — ABNORMAL HIGH (ref 125–245)

## 2013-09-01 ENCOUNTER — Telehealth: Payer: Self-pay | Admitting: *Deleted

## 2013-09-01 NOTE — Telephone Encounter (Signed)
Message copied by Domenic Schwab on Tue Sep 01, 2013 12:29 PM ------      Message from: Annia Belt      Created: Mon Aug 31, 2013  7:48 PM       Call pt lab stable; same dose hydrea; no phlebotomy; have him schedule appt with me at [facility] in May please ------

## 2013-09-01 NOTE — Telephone Encounter (Signed)
Per Dr. Beryle Beams; notified pt lab stable and stay on same dose of hydrea; no phlebotomy needed.  Phone number given to pt to schedule future appt with MD at [facility] [phone]).  Pt verbalized understanding of instructions.

## 2013-10-06 ENCOUNTER — Telehealth: Payer: Self-pay

## 2013-10-14 NOTE — Telephone Encounter (Signed)
Error

## 2013-10-20 ENCOUNTER — Ambulatory Visit (INDEPENDENT_AMBULATORY_CARE_PROVIDER_SITE_OTHER): Payer: BC Managed Care – PPO | Admitting: Oncology

## 2013-10-20 ENCOUNTER — Encounter: Payer: Self-pay | Admitting: Oncology

## 2013-10-20 VITALS — BP 124/70 | HR 65 | Temp 96.8°F | Ht 73.0 in | Wt 218.3 lb

## 2013-10-20 DIAGNOSIS — R161 Splenomegaly, not elsewhere classified: Secondary | ICD-10-CM

## 2013-10-20 DIAGNOSIS — D45 Polycythemia vera: Secondary | ICD-10-CM

## 2013-10-20 DIAGNOSIS — K274 Chronic or unspecified peptic ulcer, site unspecified, with hemorrhage: Secondary | ICD-10-CM

## 2013-10-20 DIAGNOSIS — M109 Gout, unspecified: Secondary | ICD-10-CM

## 2013-10-20 DIAGNOSIS — N400 Enlarged prostate without lower urinary tract symptoms: Secondary | ICD-10-CM

## 2013-10-20 LAB — CBC WITH DIFFERENTIAL/PLATELET
BASOS ABS: 0.1 10*3/uL (ref 0.0–0.1)
BASOS PCT: 1 % (ref 0–1)
Eosinophils Absolute: 0.1 10*3/uL (ref 0.0–0.7)
Eosinophils Relative: 1 % (ref 0–5)
HEMATOCRIT: 38.6 % — AB (ref 39.0–52.0)
HEMOGLOBIN: 12.8 g/dL — AB (ref 13.0–17.0)
LYMPHS PCT: 20 % (ref 12–46)
Lymphs Abs: 2.4 10*3/uL (ref 0.7–4.0)
MCH: 32.5 pg (ref 26.0–34.0)
MCHC: 33.2 g/dL (ref 30.0–36.0)
MCV: 98 fL (ref 78.0–100.0)
MONO ABS: 0.5 10*3/uL (ref 0.1–1.0)
MONOS PCT: 4 % (ref 3–12)
NEUTROS PCT: 74 % (ref 43–77)
Neutro Abs: 8.9 10*3/uL — ABNORMAL HIGH (ref 1.7–7.7)
Platelets: 211 10*3/uL (ref 150–400)
RBC: 3.94 MIL/uL — ABNORMAL LOW (ref 4.22–5.81)
RDW: 20.6 % — ABNORMAL HIGH (ref 11.5–15.5)
WBC: 12 10*3/uL — AB (ref 4.0–10.5)

## 2013-10-20 NOTE — Progress Notes (Signed)
Patient ID: Mark Branch, male   DOB: Dec 06, 1940, 73 y.o.   MRN: [id number] Hematology and Oncology Follow Up Visit  Mark Branch [id number] 08-Apr-1941 73 y.o. 10/20/2013 2:56 PM   Principle Diagnosis: Encounter Diagnosis  Name Primary?  Mark Branch Kitchen POLYCYTHEMIA RUBRA VERA Yes     Interim History:   Followup visit for this 73 year old man with long-standing polycythemia vera. He was initially diagnosed in December of 1991. He was on a phlebotomy program on a regular basis until April of 1998 when he had an upper GI bleed due to peptic ulcer disease. He required minimal phlebotomy subsequent to the GI bleed. He was started on Hydrea in 2002 when platelet count began to elevate to an unacceptable level. Current dose 1,000 mg daily. He has become phlebotomy independent. We have not seen any signs of progressive pancytopenia despite the fact that he is out now 26 years from initial diagnosis. He has had no interim medical problems that he will admit to. He remains active and plays golf on a regular basis. He is still the main caregiver for his wife who is Handicapped. She was cured of her lung cancer but suffered neurologic deficits as a result of cranial radiation.   Medications: reviewed  Allergies:  Allergies  Allergen Reactions  . Sulfonamide Derivatives     Review of Systems: Hematology:  No bleeding or bruising ENT ROS: No sore throat Breast ROS:  Respiratory ROS: No cough or dyspnea  Cardiovascular ROS:  No chest pain or palpitations Gastrointestinal ROS: No abdominal pain or change in bowel habits. Last colonoscopy about 2 years ago  2013. Genito-Urinary ROS: Denies any urinary tract symptoms. Musculoskeletal ROS: No muscle bone or joint pain Neurological ROS: No headache or change in vision Dermatological ROS: No rash or ecchymosis Remaining ROS negative:   Physical Exam: Blood pressure 124/70, pulse 65, temperature 96.8 F (36 C), temperature source Oral, height 6\' 1"  (1.854  m), weight 218 lb 4.8 oz (99.02 kg), SpO2 100.00%. Wt Readings from Last 3 Encounters:  10/20/13 218 lb 4.8 oz (99.02 kg)  04/13/13 214 lb 8 oz (97.297 kg)  02/16/13 215 lb (97.523 kg)     General appearance: Well-nourished Caucasian man HENNT: Pharynx no erythema, exudate, mass, or ulcer. No thyromegaly or thyroid nodules Lymph nodes: No cervical, supraclavicular, or axillary lymphadenopathy Breasts:  Lungs: Clear to auscultation, resonant to percussion throughout Heart: Regular rhythm, no murmur, no gallop, no rub, no click, no edema Abdomen: Soft, nontender, normal bowel sounds, spleen and large 15 cm below left costal margin down to the umbilicus Extremities: No edema, no calf tenderness Musculoskeletal: no joint deformities GU:  Vascular: Carotid pulses 2+, no bruits,  Neurologic: Alert, oriented, PERRLA, I was unable to get a good look at his optic discs  cranial nerves grossly normal, motor strength 5 over 5, reflexes 1+ symmetric, upper body coordination normal, gait normal, Skin: No rash or ecchymosis  Lab Results: CBC W/Diff    Component Value Date/Time   WBC 11.9* 08/31/2013 1254   RBC 3.91* 08/31/2013 1254   HGB 13.1 08/31/2013 1254   HCT 40.4 08/31/2013 1254   PLT 195 08/31/2013 1254   MCV 103.2* 08/31/2013 1254   MCH 33.5* 08/31/2013 1254   MCH 31.1 03/13/2010 1314   MCHC 32.5 08/31/2013 1254   RDW 18.4* 08/31/2013 1254   LYMPHSABS 2.2 08/31/2013 1254   MONOABS 0.2 08/31/2013 1254   EOSABS 0.1 08/31/2013 1254   BASOSABS 0.1 08/31/2013 1254  Chemistry      Component Value Date/Time   NA 142 08/31/2013 1255   NA 138 09/23/2012 1152   K 4.9 08/31/2013 1255   K 4.5 09/23/2012 1152   CL 105 09/23/2012 1152   CL 111* 08/18/2012 1357   CO2 23 08/31/2013 1255   CO2 27 09/23/2012 1152   BUN 18.0 08/31/2013 1255   BUN 19 09/23/2012 1152   CREATININE 0.9 08/31/2013 1255   CREATININE 0.9 09/23/2012 1152      Component Value Date/Time   CALCIUM 9.6 08/31/2013 1255   CALCIUM 8.8  09/23/2012 1152   ALKPHOS 55 08/31/2013 1255   ALKPHOS 50 09/23/2012 1152   AST 22 08/31/2013 1255   AST 24 09/23/2012 1152   ALT 17 08/31/2013 1255   ALT 18 09/23/2012 1152   BILITOT 2.95* 08/31/2013 1255   BILITOT 2.9* 09/23/2012 1152     Today's labs pending   Impression:  #1. Polycythemia vera Stable disease. Plan: Continue Hydrea 1000 mg daily. Aspirin 81 mg daily. Lab checks every 2 months.  #2. Splenomegaly secondary to #1.  #3. Remote GI bleed secondary to peptic ulcer disease he continues on Prilosec 20 mg daily.  #4. Benign prostatic hyperplasia  #5. Gout on chronic allopurinol   CC: Patient Care Team: Cassandria Anger, MD as PCP - General Annia Belt, MD (Hematology and Oncology) Bernestine Amass, MD as Attending Physician (Urology) Irene Shipper, MD as Attending Physician (Gastroenterology)   Annia Belt, MD 5/12/20152:56 PM

## 2013-10-20 NOTE — Patient Instructions (Signed)
Lab every 2 months Stay on same dose of Hydrea Visit with Dr Darnell Level  6 months: November 2

## 2013-10-21 LAB — COMPREHENSIVE METABOLIC PANEL
ALBUMIN: 4.7 g/dL (ref 3.5–5.2)
ALK PHOS: 59 U/L (ref 39–117)
ALT: 14 U/L (ref 0–53)
AST: 19 U/L (ref 0–37)
BUN: 19 mg/dL (ref 6–23)
CO2: 26 mEq/L (ref 19–32)
Calcium: 9.6 mg/dL (ref 8.4–10.5)
Chloride: 107 mEq/L (ref 96–112)
Creat: 0.88 mg/dL (ref 0.50–1.35)
GLUCOSE: 110 mg/dL — AB (ref 70–99)
Potassium: 4.7 mEq/L (ref 3.5–5.3)
SODIUM: 142 meq/L (ref 135–145)
TOTAL PROTEIN: 6 g/dL (ref 6.0–8.3)
Total Bilirubin: 2.9 mg/dL — ABNORMAL HIGH (ref 0.2–1.2)

## 2013-10-21 LAB — LACTATE DEHYDROGENASE: LDH: 291 U/L — ABNORMAL HIGH (ref 94–250)

## 2013-10-22 ENCOUNTER — Other Ambulatory Visit: Payer: Self-pay | Admitting: Dermatology

## 2013-11-19 ENCOUNTER — Encounter: Payer: Self-pay | Admitting: Internal Medicine

## 2013-11-19 ENCOUNTER — Ambulatory Visit (INDEPENDENT_AMBULATORY_CARE_PROVIDER_SITE_OTHER): Payer: BC Managed Care – PPO | Admitting: Internal Medicine

## 2013-11-19 ENCOUNTER — Other Ambulatory Visit (INDEPENDENT_AMBULATORY_CARE_PROVIDER_SITE_OTHER): Payer: BC Managed Care – PPO

## 2013-11-19 VITALS — BP 148/76 | HR 76 | Temp 98.2°F | Resp 16 | Ht 73.0 in | Wt 215.0 lb

## 2013-11-19 DIAGNOSIS — Z23 Encounter for immunization: Secondary | ICD-10-CM

## 2013-11-19 DIAGNOSIS — N320 Bladder-neck obstruction: Secondary | ICD-10-CM

## 2013-11-19 DIAGNOSIS — Z0000 Encounter for general adult medical examination without abnormal findings: Secondary | ICD-10-CM

## 2013-11-19 DIAGNOSIS — R9431 Abnormal electrocardiogram [ECG] [EKG]: Secondary | ICD-10-CM

## 2013-11-19 DIAGNOSIS — E785 Hyperlipidemia, unspecified: Secondary | ICD-10-CM

## 2013-11-19 DIAGNOSIS — R011 Cardiac murmur, unspecified: Secondary | ICD-10-CM

## 2013-11-19 DIAGNOSIS — D45 Polycythemia vera: Secondary | ICD-10-CM

## 2013-11-19 DIAGNOSIS — R0989 Other specified symptoms and signs involving the circulatory and respiratory systems: Secondary | ICD-10-CM

## 2013-11-19 DIAGNOSIS — Z2911 Encounter for prophylactic immunotherapy for respiratory syncytial virus (RSV): Secondary | ICD-10-CM

## 2013-11-19 DIAGNOSIS — H6120 Impacted cerumen, unspecified ear: Secondary | ICD-10-CM

## 2013-11-19 LAB — CBC WITH DIFFERENTIAL/PLATELET
BASOS PCT: 0.4 % (ref 0.0–3.0)
Basophils Absolute: 0 10*3/uL (ref 0.0–0.1)
Eosinophils Absolute: 0.1 10*3/uL (ref 0.0–0.7)
Eosinophils Relative: 1 % (ref 0.0–5.0)
HCT: 40.5 % (ref 39.0–52.0)
Hemoglobin: 13.2 g/dL (ref 13.0–17.0)
LYMPHS PCT: 18.5 % (ref 12.0–46.0)
Lymphs Abs: 2.2 10*3/uL (ref 0.7–4.0)
MCHC: 32.5 g/dL (ref 30.0–36.0)
MCV: 101.3 fl — ABNORMAL HIGH (ref 78.0–100.0)
MONOS PCT: 2.1 % — AB (ref 3.0–12.0)
Monocytes Absolute: 0.2 10*3/uL (ref 0.1–1.0)
NEUTROS PCT: 78 % — AB (ref 43.0–77.0)
Neutro Abs: 9.4 10*3/uL — ABNORMAL HIGH (ref 1.4–7.7)
Platelets: 212 10*3/uL (ref 150.0–400.0)
RBC: 4 Mil/uL — AB (ref 4.22–5.81)
RDW: 18.7 % — ABNORMAL HIGH (ref 11.5–15.5)
WBC: 12 10*3/uL — ABNORMAL HIGH (ref 4.0–10.5)

## 2013-11-19 LAB — BASIC METABOLIC PANEL
BUN: 16 mg/dL (ref 6–23)
CHLORIDE: 106 meq/L (ref 96–112)
CO2: 28 meq/L (ref 19–32)
Calcium: 9.3 mg/dL (ref 8.4–10.5)
Creatinine, Ser: 0.9 mg/dL (ref 0.4–1.5)
GFR: 83.54 mL/min (ref >60.00)
Glucose, Bld: 98 mg/dL (ref 70–99)
Potassium: 4.8 mEq/L (ref 3.5–5.1)
Sodium: 140 mEq/L (ref 135–145)

## 2013-11-19 LAB — URINALYSIS
Hgb urine dipstick: NEGATIVE
KETONES UR: NEGATIVE
Leukocytes, UA: NEGATIVE
Nitrite: NEGATIVE
UROBILINOGEN UA: 0.2 (ref 0.0–1.0)
Urine Glucose: NEGATIVE
pH: 5.5 (ref 5.0–8.0)

## 2013-11-19 LAB — HEPATIC FUNCTION PANEL
ALBUMIN: 4.3 g/dL (ref 3.5–5.2)
ALT: 17 U/L (ref 0–53)
AST: 20 U/L (ref 0–37)
Alkaline Phosphatase: 52 U/L (ref 39–117)
BILIRUBIN DIRECT: 0.3 mg/dL (ref 0.0–0.3)
TOTAL PROTEIN: 6.6 g/dL (ref 6.0–8.3)
Total Bilirubin: 3.1 mg/dL — ABNORMAL HIGH (ref 0.2–1.2)

## 2013-11-19 LAB — LIPID PANEL
Cholesterol: 105 mg/dL (ref 0–200)
HDL: 30.3 mg/dL — AB (ref >39.00)
LDL Cholesterol: 62 mg/dL (ref 0–99)
NonHDL: 74.7
TRIGLYCERIDES: 62 mg/dL (ref 0.0–149.0)
Total CHOL/HDL Ratio: 3
VLDL: 12.4 mg/dL (ref 0.0–40.0)

## 2013-11-19 LAB — TSH: TSH: 2.85 u[IU]/mL (ref 0.35–4.50)

## 2013-11-19 LAB — PSA: PSA: 0.26 ng/mL (ref 0.10–4.00)

## 2013-11-19 MED ORDER — VITAMIN C 500 MG PO TABS: 500.0000 mg | ORAL_TABLET | Freq: Every day | ORAL | Status: DC

## 2013-11-19 NOTE — Assessment & Plan Note (Signed)
ECHO

## 2013-11-19 NOTE — Assessment & Plan Note (Signed)
Doppler US ordered

## 2013-11-19 NOTE — Progress Notes (Signed)
Subjective:    HPI  The patient is here for a wellness exam. The patient has been doing well overall without major physical or psychological issues going on lately.  Wt Readings from Last 3 Encounters:  11/19/13 215 lb (97.523 kg)  10/20/13 218 lb 4.8 oz (99.02 kg)  04/13/13 214 lb 8 oz (97.297 kg)   BP Readings from Last 3 Encounters:  11/19/13 148/76  10/20/13 124/70  04/13/13 124/70      Review of Systems  Constitutional: Negative for appetite change, fatigue and unexpected weight change.  HENT: Negative for congestion, dental problem, nosebleeds, sneezing, sore throat and trouble swallowing.   Eyes: Negative for itching and visual disturbance.  Respiratory: Negative for cough.   Cardiovascular: Negative for chest pain, palpitations and leg swelling.  Gastrointestinal: Negative for nausea, vomiting, diarrhea, constipation, blood in stool and abdominal distention.  Genitourinary: Negative for urgency, frequency and hematuria.  Musculoskeletal: Negative for back pain, gait problem, joint swelling, neck pain and neck stiffness.  Skin: Negative for rash and wound.  Allergic/Immunologic: Negative for food allergies.  Neurological: Negative for dizziness, tremors, speech difficulty and weakness.  Psychiatric/Behavioral: Negative for suicidal ideas, sleep disturbance, dysphoric mood, decreased concentration and agitation. The patient is not nervous/anxious.        Objective:   Physical Exam  Constitutional: He is oriented to person, place, and time. He appears well-developed. No distress.  Hyperemic face  HENT:  Mouth/Throat: Oropharynx is clear and moist. No oropharyngeal exudate.  Eyes: Conjunctivae are normal. Pupils are equal, round, and reactive to light.  Neck: Normal range of motion. No JVD present. No thyromegaly present.  Cardiovascular: Normal rate, regular rhythm and intact distal pulses.  Exam reveals no gallop and no friction rub.   Murmur heard. B bruit   Pulmonary/Chest: Effort normal and breath sounds normal. No respiratory distress. He has no wheezes. He has no rales. He exhibits no tenderness.  Abdominal: Soft. Bowel sounds are normal. He exhibits no distension and no mass. There is no tenderness. There is no rebound and no guarding.  Genitourinary:  Rectal/prostate per Dr Risa Grill  Musculoskeletal: Normal range of motion. He exhibits no edema and no tenderness.  Lymphadenopathy:    He has no cervical adenopathy.  Neurological: He is alert and oriented to person, place, and time. He has normal reflexes. No cranial nerve deficit. He exhibits normal muscle tone. Coordination normal.  Skin: Skin is warm and dry. No rash noted.  Psychiatric: He has a normal mood and affect. His behavior is normal. Judgment and thought content normal.  Wax B ears   Lab Results  Component Value Date   WBC 12.0* 10/20/2013   HGB 12.8* 10/20/2013   HCT 38.6* 10/20/2013   PLT 211 10/20/2013   GLUCOSE 110* 10/20/2013   CHOL 109 09/23/2012   TRIG 90.0 09/23/2012   HDL 25.80* 09/23/2012   LDLCALC 65 09/23/2012   ALT 14 10/20/2013   AST 19 10/20/2013   NA 142 10/20/2013   K 4.7 10/20/2013   CL 107 10/20/2013   CREATININE 0.88 10/20/2013   BUN 19 10/20/2013   CO2 26 10/20/2013   TSH 2.35 09/23/2012   PSA 0.23 09/23/2012     Procedure Note :     Procedure :  Ear irrigation   Indication:  Cerumen impaction B   Risks, including pain, dizziness, eardrum perforation, bleeding, infection and others as well as benefits were explained to the patient in detail. Verbal consent was obtained and the patient  agreed to proceed.    We used "The Elephant Ear Irrigation Device" filled with lukewarm water for irrigation. A large amount wax was recovered. Procedure has also required manual wax removal with an ear loop.   Tolerated well. Complications: None.   Postprocedure instructions :  Call if problems.        Assessment & Plan:

## 2013-11-19 NOTE — Assessment & Plan Note (Signed)
Continue with current prescription therapy as reflected on the Med list. CBC

## 2013-11-19 NOTE — Progress Notes (Deleted)
Pre visit review using our clinic review tool, if applicable. No additional management support is needed unless otherwise documented below in the visit note. 

## 2013-11-19 NOTE — Assessment & Plan Note (Signed)
Will irrigate 

## 2013-11-19 NOTE — Assessment & Plan Note (Signed)
We discussed age appropriate health related issues, including available/recomended screening tests and vaccinations. We discussed a need for adhering to healthy diet and exercise. Labs/EKG were reviewed/ordered. All questions were answered. [facility]

## 2013-11-22 ENCOUNTER — Other Ambulatory Visit: Payer: Self-pay | Admitting: Internal Medicine

## 2013-12-08 ENCOUNTER — Ambulatory Visit (HOSPITAL_COMMUNITY): Payer: BC Managed Care – PPO | Attending: Internal Medicine | Admitting: Radiology

## 2013-12-08 DIAGNOSIS — I379 Nonrheumatic pulmonary valve disorder, unspecified: Secondary | ICD-10-CM | POA: Insufficient documentation

## 2013-12-08 DIAGNOSIS — I359 Nonrheumatic aortic valve disorder, unspecified: Secondary | ICD-10-CM | POA: Insufficient documentation

## 2013-12-08 DIAGNOSIS — I517 Cardiomegaly: Secondary | ICD-10-CM | POA: Insufficient documentation

## 2013-12-08 DIAGNOSIS — E785 Hyperlipidemia, unspecified: Secondary | ICD-10-CM | POA: Insufficient documentation

## 2013-12-08 DIAGNOSIS — R011 Cardiac murmur, unspecified: Secondary | ICD-10-CM | POA: Insufficient documentation

## 2013-12-08 DIAGNOSIS — R0989 Other specified symptoms and signs involving the circulatory and respiratory systems: Secondary | ICD-10-CM | POA: Insufficient documentation

## 2013-12-08 NOTE — Progress Notes (Signed)
Echocardiogram performed.  

## 2014-02-12 ENCOUNTER — Telehealth: Payer: Self-pay | Admitting: Oncology

## 2014-02-12 NOTE — Telephone Encounter (Signed)
Pt cld to r/s, pt is sick r/s from 09/04 ov......KJ °

## 2014-02-15 ENCOUNTER — Other Ambulatory Visit: Payer: BC Managed Care – PPO

## 2014-02-15 ENCOUNTER — Ambulatory Visit: Payer: BC Managed Care – PPO | Admitting: Oncology

## 2014-03-21 ENCOUNTER — Other Ambulatory Visit: Payer: Self-pay | Admitting: Internal Medicine

## 2014-03-26 ENCOUNTER — Other Ambulatory Visit: Payer: Self-pay

## 2014-04-02 ENCOUNTER — Other Ambulatory Visit: Payer: Self-pay | Admitting: Oncology

## 2014-04-19 ENCOUNTER — Other Ambulatory Visit: Payer: Self-pay | Admitting: Oncology

## 2014-04-29 ENCOUNTER — Other Ambulatory Visit: Payer: Self-pay | Admitting: Dermatology

## 2014-06-21 ENCOUNTER — Other Ambulatory Visit: Payer: Self-pay | Admitting: Oncology

## 2014-07-05 ENCOUNTER — Other Ambulatory Visit (INDEPENDENT_AMBULATORY_CARE_PROVIDER_SITE_OTHER): Payer: BLUE CROSS/BLUE SHIELD

## 2014-07-05 DIAGNOSIS — D45 Polycythemia vera: Secondary | ICD-10-CM

## 2014-07-05 LAB — COMPREHENSIVE METABOLIC PANEL
ALT: 12 U/L (ref 0–53)
AST: 19 U/L (ref 0–37)
Albumin: 4.7 g/dL (ref 3.5–5.2)
Alkaline Phosphatase: 56 U/L (ref 39–117)
BILIRUBIN TOTAL: 2.7 mg/dL — AB (ref 0.2–1.2)
BUN: 19 mg/dL (ref 6–23)
CALCIUM: 9.4 mg/dL (ref 8.4–10.5)
CO2: 28 mEq/L (ref 19–32)
Chloride: 106 mEq/L (ref 96–112)
Creat: 0.91 mg/dL (ref 0.50–1.35)
GLUCOSE: 115 mg/dL — AB (ref 70–99)
POTASSIUM: 5.1 meq/L (ref 3.5–5.3)
SODIUM: 141 meq/L (ref 135–145)
TOTAL PROTEIN: 6.5 g/dL (ref 6.0–8.3)

## 2014-07-05 LAB — LACTATE DEHYDROGENASE: LDH: 240 U/L (ref 94–250)

## 2014-07-06 LAB — CBC WITH DIFFERENTIAL/PLATELET
BASOS ABS: 0.1 10*3/uL (ref 0.0–0.1)
BASOS PCT: 1 % (ref 0–1)
Eosinophils Absolute: 0.1 10*3/uL (ref 0.0–0.7)
Eosinophils Relative: 1 % (ref 0–5)
HEMATOCRIT: 39.6 % (ref 39.0–52.0)
HEMOGLOBIN: 12.6 g/dL — AB (ref 13.0–17.0)
LYMPHS ABS: 2.8 10*3/uL (ref 0.7–4.0)
Lymphocytes Relative: 25 % (ref 12–46)
MCH: 32 pg (ref 26.0–34.0)
MCHC: 31.8 g/dL (ref 30.0–36.0)
MCV: 100.5 fL — ABNORMAL HIGH (ref 78.0–100.0)
MONOS PCT: 1 % — AB (ref 3–12)
Monocytes Absolute: 0.1 10*3/uL (ref 0.1–1.0)
NEUTROS ABS: 7.9 10*3/uL — AB (ref 1.7–7.7)
NEUTROS PCT: 72 % (ref 43–77)
PLATELETS: 209 10*3/uL (ref 150–400)
RBC: 3.94 MIL/uL — AB (ref 4.22–5.81)
RDW: 21.9 % — AB (ref 11.5–15.5)
WBC: 11 10*3/uL — ABNORMAL HIGH (ref 4.0–10.5)

## 2014-07-08 ENCOUNTER — Telehealth: Payer: Self-pay | Admitting: *Deleted

## 2014-07-08 NOTE — Telephone Encounter (Signed)
Pt called/ informed labs are stable and to continue current dose of Hydrea per Dr Beryle Beams.

## 2014-07-08 NOTE — Telephone Encounter (Signed)
-----   Message from Annia Belt, MD sent at 07/07/2014 11:41 AM EST ----- Call pt lab stable continue current dose of Hydrea

## 2014-07-12 ENCOUNTER — Encounter: Payer: BC Managed Care – PPO | Admitting: Oncology

## 2014-07-20 ENCOUNTER — Other Ambulatory Visit: Payer: Self-pay | Admitting: Oncology

## 2014-07-25 ENCOUNTER — Other Ambulatory Visit: Payer: Self-pay | Admitting: Internal Medicine

## 2014-08-05 ENCOUNTER — Other Ambulatory Visit: Payer: Self-pay | Admitting: Oncology

## 2014-09-20 ENCOUNTER — Other Ambulatory Visit: Payer: Self-pay | Admitting: Oncology

## 2014-10-07 ENCOUNTER — Other Ambulatory Visit: Payer: Self-pay | Admitting: Oncology

## 2014-11-10 ENCOUNTER — Other Ambulatory Visit: Payer: Self-pay | Admitting: Internal Medicine

## 2014-11-10 ENCOUNTER — Encounter: Payer: Self-pay | Admitting: Internal Medicine

## 2014-11-12 ENCOUNTER — Other Ambulatory Visit: Payer: Self-pay | Admitting: Oncology

## 2014-12-01 ENCOUNTER — Other Ambulatory Visit: Payer: Self-pay | Admitting: Oncology

## 2014-12-06 ENCOUNTER — Other Ambulatory Visit: Payer: Self-pay

## 2015-01-12 ENCOUNTER — Other Ambulatory Visit: Payer: Self-pay | Admitting: Oncology

## 2015-01-28 ENCOUNTER — Other Ambulatory Visit: Payer: Self-pay | Admitting: *Deleted

## 2015-01-28 MED ORDER — ALLOPURINOL 300 MG PO TABS: 300.0000 mg | ORAL_TABLET | Freq: Every day | ORAL | Status: DC

## 2015-02-10 ENCOUNTER — Other Ambulatory Visit: Payer: Self-pay | Admitting: Oncology

## 2015-02-25 ENCOUNTER — Other Ambulatory Visit: Payer: Self-pay | Admitting: Oncology

## 2015-03-14 ENCOUNTER — Other Ambulatory Visit: Payer: Self-pay | Admitting: Internal Medicine

## 2015-04-18 ENCOUNTER — Other Ambulatory Visit: Payer: Self-pay | Admitting: Oncology

## 2015-04-21 ENCOUNTER — Other Ambulatory Visit: Payer: Self-pay

## 2015-04-21 MED ORDER — OMEPRAZOLE 20 MG PO CPDR
20.0000 mg | DELAYED_RELEASE_CAPSULE | Freq: Every day | ORAL | Status: DC
Start: 2015-04-21 — End: 2022-09-25

## 2015-04-22 ENCOUNTER — Other Ambulatory Visit: Payer: Self-pay | Admitting: Oncology

## 2015-05-09 ENCOUNTER — Other Ambulatory Visit: Payer: Self-pay | Admitting: Oncology

## 2015-05-19 ENCOUNTER — Other Ambulatory Visit: Payer: Self-pay | Admitting: Oncology

## 2015-05-19 NOTE — Telephone Encounter (Signed)
I am going to approve a limited refill of the Hydrea. BUT, according to Dr. Synthia Innocent last note this patient needs labs every two months (CMP, CBC). No labs in our system since January. Can you see if he has been getting his usual labs outside [facility] and set up his follow up with Dr. Darnell Level?

## 2015-07-04 ENCOUNTER — Other Ambulatory Visit: Payer: Self-pay | Admitting: Student in an Organized Health Care Education/Training Program

## 2015-07-04 NOTE — Telephone Encounter (Signed)
Last scheduled visit 11/19/13 !! Do you want to refill this med?

## 2015-10-03 ENCOUNTER — Encounter: Payer: Medicare Other | Admitting: Oncology

## 2016-01-20 ENCOUNTER — Other Ambulatory Visit: Payer: Self-pay | Admitting: *Deleted

## 2016-01-25 ENCOUNTER — Other Ambulatory Visit: Payer: Self-pay | Admitting: *Deleted

## 2016-01-25 NOTE — Telephone Encounter (Signed)
Rec'd fax pt requesting refill on his Allopurinol. Duplicate request pt need ov haven't see MD since 2015...Mark Branch

## 2016-06-20 ENCOUNTER — Encounter: Payer: Self-pay | Admitting: Internal Medicine

## 2021-02-09 DEATH — deceased
